# Patient Record
Sex: Male | Born: 1965 | Race: White | Hispanic: No | Marital: Married | Smoking: Former smoker
Health system: Southern US, Community
[De-identification: ages and names within clinical notes are randomized; demographics above are authoritative.]

## PROBLEM LIST (undated history)

## (undated) DIAGNOSIS — M1990 Unspecified osteoarthritis, unspecified site: Secondary | ICD-10-CM

## (undated) DIAGNOSIS — I10 Essential (primary) hypertension: Secondary | ICD-10-CM

## (undated) DIAGNOSIS — E781 Pure hyperglyceridemia: Secondary | ICD-10-CM

## (undated) DIAGNOSIS — R809 Proteinuria, unspecified: Secondary | ICD-10-CM

## (undated) DIAGNOSIS — N529 Male erectile dysfunction, unspecified: Secondary | ICD-10-CM

## (undated) DIAGNOSIS — M8700 Idiopathic aseptic necrosis of unspecified bone: Secondary | ICD-10-CM

## (undated) DIAGNOSIS — E669 Obesity, unspecified: Secondary | ICD-10-CM

## (undated) HISTORY — DX: Idiopathic aseptic necrosis of unspecified bone: M87.00

## (undated) HISTORY — DX: Pure hyperglyceridemia: E78.1

## (undated) HISTORY — DX: Proteinuria, unspecified: R80.9

## (undated) HISTORY — PX: MOUTH SURGERY: SHX715

## (undated) HISTORY — DX: Obesity, unspecified: E66.9

## (undated) HISTORY — DX: Male erectile dysfunction, unspecified: N52.9

---

## 2009-12-15 ENCOUNTER — Ambulatory Visit: Payer: Self-pay | Admitting: Otolaryngology

## 2011-11-14 ENCOUNTER — Ambulatory Visit: Payer: Self-pay | Admitting: Sports Medicine

## 2011-12-22 ENCOUNTER — Other Ambulatory Visit (HOSPITAL_COMMUNITY): Payer: Self-pay | Admitting: Orthopaedic Surgery

## 2011-12-29 ENCOUNTER — Encounter (HOSPITAL_COMMUNITY): Payer: Self-pay | Admitting: Pharmacy Technician

## 2012-01-03 ENCOUNTER — Encounter (HOSPITAL_COMMUNITY)
Admission: RE | Admit: 2012-01-03 | Discharge: 2012-01-03 | Disposition: A | Discharge status: Home / Self Care | Payer: BC Managed Care – PPO | Source: Ambulatory Visit | Attending: Orthopaedic Surgery | Admitting: Orthopaedic Surgery

## 2012-01-03 ENCOUNTER — Encounter (HOSPITAL_COMMUNITY): Payer: Self-pay

## 2012-01-03 ENCOUNTER — Ambulatory Visit (HOSPITAL_COMMUNITY)
Admission: RE | Admit: 2012-01-03 | Discharge: 2012-01-03 | Disposition: A | Discharge status: Home / Self Care | Payer: BC Managed Care – PPO | Source: Ambulatory Visit | Attending: Orthopaedic Surgery | Admitting: Orthopaedic Surgery

## 2012-01-03 ENCOUNTER — Other Ambulatory Visit: Payer: Self-pay

## 2012-01-03 DIAGNOSIS — I10 Essential (primary) hypertension: Secondary | ICD-10-CM | POA: Insufficient documentation

## 2012-01-03 DIAGNOSIS — Z01812 Encounter for preprocedural laboratory examination: Secondary | ICD-10-CM | POA: Insufficient documentation

## 2012-01-03 DIAGNOSIS — Z01818 Encounter for other preprocedural examination: Secondary | ICD-10-CM | POA: Insufficient documentation

## 2012-01-03 DIAGNOSIS — M87059 Idiopathic aseptic necrosis of unspecified femur: Secondary | ICD-10-CM | POA: Insufficient documentation

## 2012-01-03 DIAGNOSIS — Z01810 Encounter for preprocedural cardiovascular examination: Secondary | ICD-10-CM | POA: Insufficient documentation

## 2012-01-03 HISTORY — DX: Essential (primary) hypertension: I10

## 2012-01-03 HISTORY — DX: Unspecified osteoarthritis, unspecified site: M19.90

## 2012-01-03 LAB — URINALYSIS, ROUTINE W REFLEX MICROSCOPIC
Glucose, UA: NEGATIVE mg/dL
Hgb urine dipstick: NEGATIVE
Leukocytes, UA: NEGATIVE
Specific Gravity, Urine: 1.018 (ref 1.005–1.030)
Urobilinogen, UA: 0.2 mg/dL (ref 0.0–1.0)

## 2012-01-03 LAB — PROTIME-INR: INR: 0.94 (ref 0.00–1.49)

## 2012-01-03 LAB — CBC
HCT: 43.7 % (ref 39.0–52.0)
Hemoglobin: 14.7 g/dL (ref 13.0–17.0)
RDW: 12.4 % (ref 11.5–15.5)
WBC: 4.2 10*3/uL (ref 4.0–10.5)

## 2012-01-03 LAB — BASIC METABOLIC PANEL
Chloride: 102 mEq/L (ref 96–112)
GFR calc Af Amer: >90 mL/min (ref >90)
GFR calc non Af Amer: >90 mL/min (ref >90)
Potassium: 4.1 mEq/L (ref 3.5–5.1)
Sodium: 139 mEq/L (ref 135–145)

## 2012-01-03 LAB — SURGICAL PCR SCREEN
MRSA, PCR: POSITIVE — AB
Staphylococcus aureus: POSITIVE — AB

## 2012-01-03 NOTE — Pre-Procedure Instructions (Signed)
Spoke with Judeth Cornfield at [employer]- notified her of abnormal urine and will have provider review

## 2012-01-03 NOTE — Patient Instructions (Addendum)
20 Sergio Parker  01/03/2012   Your procedure is scheduled on:  01/12/12       Friday     Surgery 0930-1100  Report to [facility] at   0700    AM.  Call this number if you have problems the morning of surgery: [phone]     Or PST   [id number]  [facility]   Remember:   Do not eat food:After Midnight. Thursday night  May have clear liquids: Thursday night until midnight   Then none and no snuff  Clear liquids include soda, tea, black coffee, apple or grape juice, broth.  Take these medicines the morning of surgery with A SIP OF WATER:  none   Do not wear jewelry, make-up or nail polish.  Do not wear lotions, powders, or perfumes. You may wear deodorant.  Do not shave 48 hours prior to surgery.  Do not bring valuables to the hospital.  Contacts, dentures or bridgework may not be worn into surgery.  Leave suitcase in the car. After surgery it may be brought to your room.  For patients admitted to the hospital, checkout time is 11:00 AM the day of discharge.   Patients discharged the day of surgery will not be allowed to drive home.  Name and phone number of your driver:  Sheralyn Boatman    wife                                                                    Special Instructions: CHG Shower Use Special Wash: 1/2 bottle night before surgery and 1/2 bottle morning of surgery. REGULAR SOAP FACE AND PRIVATES                      MEN-MAY SHAVE FACE MORNING OF SURGERY  Please read over the following fact sheets that you were given: MRSA Information

## 2012-01-04 NOTE — Pre-Procedure Instructions (Signed)
EKG  Ok per Dr Okey Dupre.  I had attempted to obtain old report for comparison  from Dr Neale Burly- none per Medical Records

## 2012-01-12 ENCOUNTER — Encounter (HOSPITAL_COMMUNITY): Payer: Self-pay | Admitting: *Deleted

## 2012-01-12 ENCOUNTER — Ambulatory Visit (HOSPITAL_COMMUNITY): Payer: BC Managed Care – PPO

## 2012-01-12 ENCOUNTER — Encounter (HOSPITAL_COMMUNITY): Payer: Self-pay | Admitting: Anesthesiology

## 2012-01-12 ENCOUNTER — Encounter (HOSPITAL_COMMUNITY)
Admission: RE | Disposition: A | Discharge status: Home / Self Care | Payer: Self-pay | Source: Ambulatory Visit | Attending: Orthopaedic Surgery

## 2012-01-12 ENCOUNTER — Inpatient Hospital Stay (HOSPITAL_COMMUNITY)
Admission: RE | Admit: 2012-01-12 | Discharge: 2012-01-14 | DRG: 818 | Disposition: A | Discharge status: Home / Self Care | Payer: BC Managed Care – PPO | Source: Ambulatory Visit | Attending: Orthopaedic Surgery | Admitting: Orthopaedic Surgery

## 2012-01-12 ENCOUNTER — Ambulatory Visit (HOSPITAL_COMMUNITY): Payer: BC Managed Care – PPO | Admitting: Anesthesiology

## 2012-01-12 DIAGNOSIS — I10 Essential (primary) hypertension: Secondary | ICD-10-CM | POA: Diagnosis present

## 2012-01-12 DIAGNOSIS — M129 Arthropathy, unspecified: Secondary | ICD-10-CM | POA: Diagnosis present

## 2012-01-12 DIAGNOSIS — M87059 Idiopathic aseptic necrosis of unspecified femur: Principal | ICD-10-CM | POA: Diagnosis present

## 2012-01-12 DIAGNOSIS — Z87891 Personal history of nicotine dependence: Secondary | ICD-10-CM

## 2012-01-12 HISTORY — PX: TOTAL HIP ARTHROPLASTY: SHX124

## 2012-01-12 LAB — TYPE AND SCREEN

## 2012-01-12 LAB — ABO/RH: ABO/RH(D): O POS

## 2012-01-12 SURGERY — ARTHROPLASTY, HIP, TOTAL, ANTERIOR APPROACH
Anesthesia: General | Site: Hip | Laterality: Left | Wound class: Clean

## 2012-01-12 MED ORDER — CEFAZOLIN SODIUM-DEXTROSE 2-3 GM-% IV SOLR: 2.0000 g | INTRAVENOUS | Status: DC

## 2012-01-12 MED ORDER — FENTANYL CITRATE 0.05 MG/ML IJ SOLN
25.0000 ug | INTRAMUSCULAR | Status: DC | PRN
  Administered 2012-01-12 (×2): 50 ug via INTRAVENOUS

## 2012-01-12 MED ORDER — SODIUM CHLORIDE 0.9 % IV SOLN
INTRAVENOUS | Status: DC
  Administered 2012-01-12: 17:00:00 via INTRAVENOUS

## 2012-01-12 MED ORDER — SODIUM CHLORIDE 0.9 % IJ SOLN: 9.0000 mL | INTRAMUSCULAR | Status: DC | PRN

## 2012-01-12 MED ORDER — NALOXONE HCL 0.4 MG/ML IJ SOLN: 0.4000 mg | INTRAMUSCULAR | Status: DC | PRN

## 2012-01-12 MED ORDER — MORPHINE SULFATE (PF) 1 MG/ML IV SOLN
INTRAVENOUS | Status: DC
  Administered 2012-01-12: 15 mg via INTRAVENOUS
  Administered 2012-01-12: 13.5 mg via INTRAVENOUS
  Administered 2012-01-12: 6 mg via INTRAVENOUS
  Administered 2012-01-13: 10.5 mg via INTRAVENOUS
  Administered 2012-01-13: 12 mg via INTRAVENOUS
  Filled 2012-01-12 (×2): qty 25

## 2012-01-12 MED ORDER — SODIUM CHLORIDE 0.9 % IR SOLN
Status: DC | PRN
  Administered 2012-01-12: 1000 mL

## 2012-01-12 MED ORDER — ONDANSETRON HCL 4 MG/2ML IJ SOLN: 4.0000 mg | Freq: Four times a day (QID) | INTRAMUSCULAR | Status: DC | PRN

## 2012-01-12 MED ORDER — PROMETHAZINE HCL 25 MG/ML IJ SOLN: 6.2500 mg | INTRAMUSCULAR | Status: DC | PRN

## 2012-01-12 MED ORDER — FENTANYL CITRATE 0.05 MG/ML IJ SOLN
INTRAMUSCULAR | Status: DC | PRN
  Administered 2012-01-12 (×3): 50 ug via INTRAVENOUS
  Administered 2012-01-12 (×2): 100 ug via INTRAVENOUS
  Administered 2012-01-12 (×3): 50 ug via INTRAVENOUS

## 2012-01-12 MED ORDER — FERROUS SULFATE 325 (65 FE) MG PO TABS
325.0000 mg | ORAL_TABLET | Freq: Three times a day (TID) | ORAL | Status: DC
  Administered 2012-01-12 – 2012-01-14 (×6): 325 mg via ORAL
  Filled 2012-01-12 (×6): qty 1

## 2012-01-12 MED ORDER — MORPHINE SULFATE 2 MG/ML IJ SOLN: 1.0000 mg | INTRAMUSCULAR | Status: DC | PRN

## 2012-01-12 MED ORDER — PHENOL 1.4 % MT LIQD: 1.0000 | OROMUCOSAL | Status: DC | PRN

## 2012-01-12 MED ORDER — ONDANSETRON HCL 4 MG PO TABS: 4.0000 mg | ORAL_TABLET | Freq: Four times a day (QID) | ORAL | Status: DC | PRN

## 2012-01-12 MED ORDER — ROCURONIUM BROMIDE 100 MG/10ML IV SOLN
INTRAVENOUS | Status: DC | PRN
  Administered 2012-01-12: 20 mg via INTRAVENOUS
  Administered 2012-01-12: 10 mg via INTRAVENOUS
  Administered 2012-01-12: 50 mg via INTRAVENOUS

## 2012-01-12 MED ORDER — METHOCARBAMOL 500 MG PO TABS
500.0000 mg | ORAL_TABLET | Freq: Four times a day (QID) | ORAL | Status: DC | PRN
  Administered 2012-01-13 – 2012-01-14 (×4): 500 mg via ORAL
  Filled 2012-01-12 (×4): qty 1

## 2012-01-12 MED ORDER — LACTATED RINGERS IV SOLN
INTRAVENOUS | Status: DC
  Administered 2012-01-12: 1000 mL via INTRAVENOUS

## 2012-01-12 MED ORDER — LACTATED RINGERS IV SOLN: INTRAVENOUS | Status: DC

## 2012-01-12 MED ORDER — ACETAMINOPHEN 650 MG RE SUPP: 650.0000 mg | Freq: Four times a day (QID) | RECTAL | Status: DC | PRN

## 2012-01-12 MED ORDER — METHOCARBAMOL 100 MG/ML IJ SOLN
500.0000 mg | Freq: Four times a day (QID) | INTRAVENOUS | Status: DC | PRN
  Administered 2012-01-12 – 2012-01-13 (×2): 500 mg via INTRAVENOUS
  Filled 2012-01-12 (×3): qty 5

## 2012-01-12 MED ORDER — RIVAROXABAN 10 MG PO TABS
10.0000 mg | ORAL_TABLET | Freq: Every day | ORAL | Status: DC
  Administered 2012-01-13 – 2012-01-14 (×2): 10 mg via ORAL
  Filled 2012-01-12 (×2): qty 1

## 2012-01-12 MED ORDER — OXYCODONE HCL 5 MG PO TABS
5.0000 mg | ORAL_TABLET | ORAL | Status: DC | PRN
  Administered 2012-01-13 (×4): 10 mg via ORAL
  Filled 2012-01-12 (×4): qty 2

## 2012-01-12 MED ORDER — HYDROCODONE-ACETAMINOPHEN 5-325 MG PO TABS
1.0000 | ORAL_TABLET | ORAL | Status: DC | PRN
  Administered 2012-01-14 (×3): 2 via ORAL
  Filled 2012-01-12 (×4): qty 2

## 2012-01-12 MED ORDER — GLYCOPYRROLATE 0.2 MG/ML IJ SOLN
INTRAMUSCULAR | Status: DC | PRN
  Administered 2012-01-12: 0.6 mg via INTRAVENOUS

## 2012-01-12 MED ORDER — METOCLOPRAMIDE HCL 5 MG/ML IJ SOLN: 5.0000 mg | Freq: Three times a day (TID) | INTRAMUSCULAR | Status: DC | PRN

## 2012-01-12 MED ORDER — CEFAZOLIN SODIUM 1-5 GM-% IV SOLN
INTRAVENOUS | Status: AC
  Filled 2012-01-12: qty 100

## 2012-01-12 MED ORDER — ZOLPIDEM TARTRATE 5 MG PO TABS: 5.0000 mg | ORAL_TABLET | Freq: Every evening | ORAL | Status: DC | PRN

## 2012-01-12 MED ORDER — DOCUSATE SODIUM 100 MG PO CAPS
100.0000 mg | ORAL_CAPSULE | Freq: Two times a day (BID) | ORAL | Status: DC
  Administered 2012-01-12 – 2012-01-14 (×4): 100 mg via ORAL
  Filled 2012-01-12 (×4): qty 1

## 2012-01-12 MED ORDER — DIPHENHYDRAMINE HCL 50 MG/ML IJ SOLN: 12.5000 mg | Freq: Four times a day (QID) | INTRAMUSCULAR | Status: DC | PRN

## 2012-01-12 MED ORDER — FENTANYL CITRATE 0.05 MG/ML IJ SOLN
INTRAMUSCULAR | Status: AC
  Filled 2012-01-12: qty 2

## 2012-01-12 MED ORDER — CEFAZOLIN SODIUM 1-5 GM-% IV SOLN
INTRAVENOUS | Status: DC | PRN
  Administered 2012-01-12: 2 g via INTRAVENOUS

## 2012-01-12 MED ORDER — MORPHINE SULFATE (PF) 1 MG/ML IV SOLN
INTRAVENOUS | Status: AC
  Filled 2012-01-12: qty 25

## 2012-01-12 MED ORDER — MIDAZOLAM HCL 5 MG/5ML IJ SOLN
INTRAMUSCULAR | Status: DC | PRN
  Administered 2012-01-12: 2 mg via INTRAVENOUS

## 2012-01-12 MED ORDER — LIDOCAINE HCL (CARDIAC) 20 MG/ML IV SOLN
INTRAVENOUS | Status: DC | PRN
  Administered 2012-01-12: 50 mg via INTRAVENOUS

## 2012-01-12 MED ORDER — ALUM & MAG HYDROXIDE-SIMETH 200-200-20 MG/5ML PO SUSP: 30.0000 mL | ORAL | Status: DC | PRN

## 2012-01-12 MED ORDER — DIPHENHYDRAMINE HCL 12.5 MG/5ML PO ELIX: 12.5000 mg | ORAL_SOLUTION | Freq: Four times a day (QID) | ORAL | Status: DC | PRN

## 2012-01-12 MED ORDER — DIPHENHYDRAMINE HCL 12.5 MG/5ML PO ELIX
12.5000 mg | ORAL_SOLUTION | Freq: Four times a day (QID) | ORAL | Status: DC | PRN
  Administered 2012-01-13: 12.5 mg via ORAL
  Filled 2012-01-12: qty 5

## 2012-01-12 MED ORDER — CEFAZOLIN SODIUM 1-5 GM-% IV SOLN
1.0000 g | Freq: Four times a day (QID) | INTRAVENOUS | Status: AC
  Administered 2012-01-12 – 2012-01-13 (×3): 1 g via INTRAVENOUS
  Filled 2012-01-12 (×3): qty 50

## 2012-01-12 MED ORDER — PROPOFOL 10 MG/ML IV BOLUS
INTRAVENOUS | Status: DC | PRN
  Administered 2012-01-12: 200 mg via INTRAVENOUS

## 2012-01-12 MED ORDER — LACTATED RINGERS IV SOLN
INTRAVENOUS | Status: DC | PRN
  Administered 2012-01-12 (×3): via INTRAVENOUS

## 2012-01-12 MED ORDER — NEOSTIGMINE METHYLSULFATE 1 MG/ML IJ SOLN
INTRAMUSCULAR | Status: DC | PRN
  Administered 2012-01-12: 5 mg via INTRAVENOUS

## 2012-01-12 MED ORDER — METOCLOPRAMIDE HCL 10 MG PO TABS: 5.0000 mg | ORAL_TABLET | Freq: Three times a day (TID) | ORAL | Status: DC | PRN

## 2012-01-12 MED ORDER — DIPHENHYDRAMINE HCL 12.5 MG/5ML PO ELIX: 12.5000 mg | ORAL_SOLUTION | ORAL | Status: DC | PRN

## 2012-01-12 MED ORDER — DEXAMETHASONE SODIUM PHOSPHATE 10 MG/ML IJ SOLN
INTRAMUSCULAR | Status: DC | PRN
  Administered 2012-01-12: 10 mg via INTRAVENOUS

## 2012-01-12 MED ORDER — ACETAMINOPHEN 325 MG PO TABS: 650.0000 mg | ORAL_TABLET | Freq: Four times a day (QID) | ORAL | Status: DC | PRN

## 2012-01-12 MED ORDER — LISINOPRIL 40 MG PO TABS
40.0000 mg | ORAL_TABLET | Freq: Every day | ORAL | Status: DC
  Administered 2012-01-13 – 2012-01-14 (×2): 40 mg via ORAL
  Filled 2012-01-12 (×2): qty 1

## 2012-01-12 MED ORDER — DIPHENHYDRAMINE HCL 50 MG/ML IJ SOLN
12.5000 mg | Freq: Four times a day (QID) | INTRAMUSCULAR | Status: DC | PRN
  Administered 2012-01-13: 12.5 mg via INTRAVENOUS
  Filled 2012-01-12: qty 1

## 2012-01-12 MED ORDER — MORPHINE SULFATE (PF) 1 MG/ML IV SOLN
INTRAVENOUS | Status: DC
  Administered 2012-01-12: 12:00:00 via INTRAVENOUS
  Administered 2012-01-12: 6 mg via INTRAVENOUS

## 2012-01-12 MED ORDER — ONDANSETRON HCL 4 MG/2ML IJ SOLN
INTRAMUSCULAR | Status: DC | PRN
  Administered 2012-01-12: 4 mg via INTRAVENOUS

## 2012-01-12 MED ORDER — ACETAMINOPHEN 10 MG/ML IV SOLN
INTRAVENOUS | Status: AC
  Filled 2012-01-12: qty 100

## 2012-01-12 MED ORDER — MENTHOL 3 MG MT LOZG: 1.0000 | LOZENGE | OROMUCOSAL | Status: DC | PRN

## 2012-01-12 SURGICAL SUPPLY — 35 items
BAG SPEC THK2 15X12 ZIP CLS (MISCELLANEOUS) ×2
BAG ZIPLOCK 12X15 (MISCELLANEOUS) ×4 IMPLANT
BLADE SAW SGTL 18X1.27X75 (BLADE) ×2 IMPLANT
CELLS DAT CNTRL 66122 CELL SVR (MISCELLANEOUS) ×1 IMPLANT
CLOTH BEACON ORANGE TIMEOUT ST (SAFETY) ×2 IMPLANT
DRAPE C-ARM 42X72 X-RAY (DRAPES) ×2 IMPLANT
DRAPE STERI IOBAN 125X83 (DRAPES) ×2 IMPLANT
DRAPE U-SHAPE 47X51 STRL (DRAPES) ×6 IMPLANT
DRSG MEPILEX BORDER 4X8 (GAUZE/BANDAGES/DRESSINGS) ×2 IMPLANT
DURAPREP 26ML APPLICATOR (WOUND CARE) ×2 IMPLANT
ELECT BLADE TIP CTD 4 INCH (ELECTRODE) ×2 IMPLANT
ELECT REM PT RETURN 9FT ADLT (ELECTROSURGICAL) ×2
ELECTRODE REM PT RTRN 9FT ADLT (ELECTROSURGICAL) ×1 IMPLANT
FACESHIELD LNG OPTICON STERILE (SAFETY) ×8 IMPLANT
GAUZE XEROFORM 1X8 LF (GAUZE/BANDAGES/DRESSINGS) ×2 IMPLANT
GLOVE BIO SURGEON STRL SZ7 (GLOVE) ×2 IMPLANT
GLOVE BIO SURGEON STRL SZ7.5 (GLOVE) ×2 IMPLANT
GLOVE BIOGEL PI IND STRL 7.5 (GLOVE) IMPLANT
GLOVE BIOGEL PI IND STRL 8 (GLOVE) ×1 IMPLANT
GLOVE BIOGEL PI INDICATOR 7.5 (GLOVE)
GLOVE BIOGEL PI INDICATOR 8 (GLOVE) ×1
GLOVE ECLIPSE 7.0 STRL STRAW (GLOVE) ×2 IMPLANT
GOWN STRL REIN XL XLG (GOWN DISPOSABLE) ×4 IMPLANT
KIT BASIN OR (CUSTOM PROCEDURE TRAY) ×2 IMPLANT
PACK TOTAL JOINT (CUSTOM PROCEDURE TRAY) ×2 IMPLANT
PADDING CAST COTTON 6X4 STRL (CAST SUPPLIES) ×2 IMPLANT
RTRCTR WOUND ALEXIS 18CM MED (MISCELLANEOUS) ×2
STAPLER SKIN PROX WIDE 3.9 (STAPLE) IMPLANT
SUT ETHIBOND NAB CT1 #1 30IN (SUTURE) ×4 IMPLANT
SUT VIC AB 1 CT1 36 (SUTURE) ×4 IMPLANT
SUT VIC AB 2-0 CT1 27 (SUTURE) ×4
SUT VIC AB 2-0 CT1 TAPERPNT 27 (SUTURE) ×2 IMPLANT
TOWEL OR 17X26 10 PK STRL BLUE (TOWEL DISPOSABLE) ×4 IMPLANT
TOWEL OR NON WOVEN STRL DISP B (DISPOSABLE) ×2 IMPLANT
TRAY FOLEY CATH 14FRSI W/METER (CATHETERS) ×2 IMPLANT

## 2012-01-12 NOTE — Anesthesia Preprocedure Evaluation (Signed)
Anesthesia Evaluation  Patient identified by MRN, date of birth, ID band Patient awake    Reviewed: Allergy & Precautions, H&P , NPO status , Patient's Chart, lab work & pertinent test results  History of Anesthesia Complications Negative for: history of anesthetic complications  Airway Mallampati: II TM Distance: >3 FB Neck ROM: Full    Dental  (+) Teeth Intact, Poor Dentition and Chipped,    Pulmonary neg pulmonary ROS,  breath sounds clear to auscultation  Pulmonary exam normal       Cardiovascular hypertension, Pt. on medications negative cardio ROS  Rhythm:Regular Rate:Normal     Neuro/Psych negative neurological ROS  negative psych ROS   GI/Hepatic negative GI ROS, Neg liver ROS, (+)     substance abuse (Chews tobacco)  alcohol use,   Endo/Other  negative endocrine ROS  Renal/GU negative Renal ROS  negative genitourinary   Musculoskeletal negative musculoskeletal ROS (+)   Abdominal   Peds  Hematology negative hematology ROS (+)   Anesthesia Other Findings   Reproductive/Obstetrics negative OB ROS                           Anesthesia Physical Anesthesia Plan  ASA: II  Anesthesia Plan: General   Post-op Pain Management:    Induction: Intravenous  Airway Management Planned: Oral ETT  Additional Equipment:   Intra-op Plan:   Post-operative Plan: Extubation in OR  Informed Consent: I have reviewed the patients History and Physical, chart, labs and discussed the procedure including the risks, benefits and alternatives for the proposed anesthesia with the patient or authorized representative who has indicated his/her understanding and acceptance.   Dental advisory given  Plan Discussed with: CRNA  Anesthesia Plan Comments:         Anesthesia Quick Evaluation

## 2012-01-12 NOTE — Preoperative (Signed)
Beta Blockers   Reason not to administer Beta Blockers:Not Applicable 

## 2012-01-12 NOTE — Transfer of Care (Signed)
Immediate Anesthesia Transfer of Care Note  Patient: Sergio Parker  Procedure(s) Performed: Procedure(s) (LRB): TOTAL HIP ARTHROPLASTY ANTERIOR APPROACH (Left)  Patient Location: PACU  Anesthesia Type: General  Level of Consciousness: awake, alert , patient cooperative and responds to stimulation  Airway & Oxygen Therapy: Patient Spontanous Breathing and Patient connected to face mask oxygen  Post-op Assessment: Report given to PACU RN, Post -op Vital signs reviewed and stable and Patient moving all extremities X 4  Post vital signs: Reviewed and stable  Complications: No apparent anesthesia complications

## 2012-01-12 NOTE — Op Note (Signed)
NAMENEIZAN, DEBRUHL NO.:  [id number]  MEDICAL RECORD NO.:  [id number]  LOCATION:  1614                         FACILITY:  [facility]  PHYSICIAN:  Vanita Panda. Magnus Ivan, M.D.DATE OF BIRTH:  03-20-1966  DATE OF PROCEDURE:  01/12/2012 DATE OF DISCHARGE:                              OPERATIVE REPORT   PREOPERATIVE DIAGNOSIS:  Left hip end-stage avascular necrosis.  POSTOPERATIVE DIAGNOSIS:  Left hip end-stage avascular necrosis.  PROCEDURE:  Left total hip arthroplasty through direct anterior approach.  IMPLANTS:  DePuy sector Gription acetabular component size 50, size 32+ 4 neutral polyethylene liner, size 9 Corail femoral component with standard offset, and size 32+ 1 ceramic hip ball.  SURGEON:  Vanita Panda. Magnus Ivan, M.D.  ASSISTANT:  Veverly Fells. Ophelia Charter, M.D.  ANESTHESIA:  General.  BLOOD LOSS:  Less than 800 cc.  COMPLICATIONS:  None.  INDICATIONS:  Sergio Parker is a 46 year old patient well-known to me.  He has avascular necrosis in both of his hips with the left worse than right. This was confirmed from plain films and MRI.  His left hip hurts him on a daily basis.  He has pain with internal, external rotation and flexion extension, it is a daily grind now for him.  He has tried injections, anti-inflammatories, and everything has failed, and he is at the point where he needs to still continue work and wishes to proceed with a direct anterior hip replacement as he is someone who gets down on his knees quite a bit at work.  The risks and benefits of surgery were explained to him in detail, and he does wish to proceed with surgery.  DESCRIPTION OF THE PROCEDURE:  After informed consent was obtained, appropriate left hip was marked.  He was brought to the operating room and while on the stretcher general anesthesia was obtained.  A Foley catheter was placed and then traction boots were placed on his feet.  He was then placed supine on the Hana fracture  table.  Perineal post was placed and both feet were placed in traction devices, but no traction applied.  His left hip was then prepped and draped with DuraPrep and sterile drapes.  A time-out was called to identify the correct patient and correct left hip.  I then made an incision just distal and inferior to the anterior superior iliac spine and carried this obliquely down the leg.  I dissected down to the tensor fascia lata muscle and then divided the tensor fascia obliquely to proceed with direct anterior approach to the hip.  A Cobb retractor was placed around the lateral neck and then one medially up underneath the rectus femoris.  I then cauterized the lateral femoral circumflex vessels and was able to get down to the hip capsule and divided the hip capsule, put the Cobb retractors within the hip capsule.  I made my femoral neck cut just proximal to the lesser trochanter and I used a cord screw guide to remove some of the femoral head.  There was significant collapse of the femoral head assuring the AVN.  I then cleaned the acetabulum of debris, and then placing a bent Hohmann medially and a Cobb retractor laterally began reaming from size  42 reamer up in 10 mm increments up to a size 50.  The size 50 was felt to be most stable, and I passed the last few reamers under direct visualization and fluoroscopic guidance.  Once I was able to obtain my inclination and version as well as depth, I placed a real size 50 acetabular component Gription with Gription from DePuy.  I knocked this into place and did not need to fill the screw holes.  All traction was off the leg and I externally rotated the leg to 90 degrees.  I extended and adducted the leg.  Then I began broaching from a size 8 broach to a size 9 because he had such thick cortical bone with him being only 46 years old.  I trialed a standard neck with a 32+ 1 hip ball and reduced this in the acetabulum, and it was surprisingly stable  with internal, external rotation.  He had the correct version.  I was pleased with the leg length measuring the hip center and the pelvis.  I then removed all trial components and placed the real HA-coated femoral component from Corail and DePuy size 9.  I placed the real ceramic 32+ 1 hip ball and reduced this back into the acetabulum, it was again stable.  His leg lengths were equal as well.  I then irrigated the soft tissue with normal saline solution.  I closed the joint capsule with #1 Ethibond suture followed by running #1 Vicryl in the tensor fascia lata, 2-0 Vicryl subcutaneous tissue, and staples on the skin.  A well-padded sterile dressing was applied.  He was awakened, extubated, and taken to recovery room in stable condition.  All final counts were correct. There were no complications noted.     Vanita Panda. Magnus Ivan, M.D.     CYB/MEDQ  D:  01/12/2012  T:  01/12/2012  Job:  [id number]

## 2012-01-12 NOTE — H&P (Signed)
Sergio Parker is an 46 y.o. male.   Chief Complaint:   Severe pain left hip with known avascular necrosis HPI:   46 yo male with late stage AVN of his left hip.  This now greatly affects his daily life with pain with most activites.  He wishes to proceed with a total hip replacement.  He understands the risks of blood loss, fracture, DVT, and PE.  The goals are decreased pain and improved mobility.  Past Medical History  Diagnosis Date  . Hypertension     PCP Dr Wylene Simmer  . Arthritis     Past Surgical History  Procedure Date  . Mouth surgery     No family history on file. Social History:  reports that he has quit smoking. His smoking use included Cigars. His smokeless tobacco use includes Snuff. He reports that he drinks alcohol. He reports that he does not use illicit drugs.  Allergies: No Known Allergies  Medications Prior to Admission  Medication Dose Route Frequency Provider Last Rate Last Dose  . ceFAZolin (ANCEF) IVPB 2 g/50 mL premix  2 g Intravenous 60 min Pre-Op Kathryne Hitch, MD       No current outpatient prescriptions on file as of 01/12/2012.    No results found for this or any previous visit (from the past 48 hour(s)). No results found.  Review of Systems  All other systems reviewed and are negative.    Blood pressure 137/87, pulse 72, temperature 97.7 F (36.5 C), temperature source Oral, resp. rate 18, height 5\' 8"  (1.727 m), weight 74.503 kg (164 lb 4 oz), SpO2 99.00%. Physical Exam  Constitutional: He is oriented to person, place, and time. He appears well-developed and well-nourished.  HENT:  Head: Normocephalic and atraumatic.  Eyes: EOM are normal. Pupils are equal, round, and reactive to light.  Neck: Normal range of motion. Neck supple.  Cardiovascular: Normal rate and regular rhythm.   Respiratory: Effort normal and breath sounds normal.  GI: Soft. Bowel sounds are normal.  Musculoskeletal:       Left hip: He exhibits decreased range  of motion, bony tenderness and crepitus.  Neurological: He is alert and oriented to person, place, and time.  Skin: Skin is warm and dry.  Psychiatric: He has a normal mood and affect.     Assessment/Plan To the OR today for a left total hip replacement then admission as an inpatient.  Gennesis Hogland Y 01/12/2012, 7:11 AM

## 2012-01-12 NOTE — H&P (Signed)
  I have seen and examined Sergio Parker at the bedside.  He wishes to proceed with a left total hip replacement.  There has been no change since I saw him as an outpatient.  He fully understands the risks and benefits.

## 2012-01-12 NOTE — Brief Op Note (Signed)
01/12/2012  12:06 PM  PATIENT:  Sergio Parker  46 y.o. male  PRE-OPERATIVE DIAGNOSIS:  left hip avascular necrosis  POST-OPERATIVE DIAGNOSIS:  left hip avascular necrosis  PROCEDURE:  Procedure(s) (LRB): TOTAL HIP ARTHROPLASTY ANTERIOR APPROACH (Left)  SURGEON:  Surgeon(s) and Role:    * Kathryne Hitch, MD - Primary    * Eldred Manges, MD - Assisting  PHYSICIAN ASSISTANT:   ASSISTANTS: Annell Greening, MD   ANESTHESIA:   general  EBL:  Total I/O In: 2000 [I.V.:2000] Out: 600 [Urine:400; Blood:200]  BLOOD ADMINISTERED:none  DRAINS: none   LOCAL MEDICATIONS USED:  NONE  SPECIMEN:  No Specimen  DISPOSITION OF SPECIMEN:  N/A  COUNTS:  YES  TOURNIQUET:  * No tourniquets in log *  DICTATION: .Other Dictation: Dictation Number [id number]  PLAN OF CARE: Admit to inpatient   PATIENT DISPOSITION:  PACU - hemodynamically stable.   Delay start of Pharmacological VTE agent (>24hrs) due to surgical blood loss or risk of bleeding: not applicable

## 2012-01-12 NOTE — Anesthesia Procedure Notes (Signed)
Procedure Name: Intubation Date/Time: 01/12/2012 10:20 AM Performed by: Randon Goldsmith CATHERINE PAYNE Pre-anesthesia Checklist: Patient identified, Emergency Drugs available, Suction available and Patient being monitored Patient Re-evaluated:Patient Re-evaluated prior to inductionOxygen Delivery Method: Circle system utilized Preoxygenation: Pre-oxygenation with 100% oxygen Intubation Type: IV induction Ventilation: Mask ventilation without difficulty and Oral airway inserted - appropriate to patient size Laryngoscope Size: Miller and 3 Grade View: Grade II Tube type: Oral Number of attempts: 1 Airway Equipment and Method: Stylet Placement Confirmation: ETT inserted through vocal cords under direct vision,  positive ETCO2 and breath sounds checked- equal and bilateral Secured at: 21 cm Tube secured with: Tape Dental Injury: Teeth and Oropharynx as per pre-operative assessment

## 2012-01-13 LAB — HEPATIC FUNCTION PANEL
AST: 21 U/L (ref 0–37)
Albumin: 3.5 g/dL (ref 3.5–5.2)
Alkaline Phosphatase: 69 U/L (ref 39–117)
Bilirubin, Direct: <0.1 mg/dL (ref 0.0–0.3)
Total Bilirubin: 0.4 mg/dL (ref 0.3–1.2)

## 2012-01-13 LAB — CBC
HCT: 36 % — ABNORMAL LOW (ref 39.0–52.0)
Hemoglobin: 12.1 g/dL — ABNORMAL LOW (ref 13.0–17.0)
MCH: 29.5 pg (ref 26.0–34.0)
MCHC: 33.6 g/dL (ref 30.0–36.0)
RBC: 4.1 MIL/uL — ABNORMAL LOW (ref 4.22–5.81)

## 2012-01-13 LAB — BASIC METABOLIC PANEL
BUN: 8 mg/dL (ref 6–23)
CO2: 29 mEq/L (ref 19–32)
Calcium: 9 mg/dL (ref 8.4–10.5)
Glucose, Bld: 116 mg/dL — ABNORMAL HIGH (ref 70–99)
Sodium: 138 mEq/L (ref 135–145)

## 2012-01-13 MED ORDER — OXYCODONE-ACETAMINOPHEN 5-325 MG PO TABS: 1.0000 | ORAL_TABLET | ORAL | Status: AC | PRN

## 2012-01-13 MED ORDER — RIVAROXABAN 10 MG PO TABS: 10.0000 mg | ORAL_TABLET | Freq: Every day | ORAL | Status: DC

## 2012-01-13 MED ORDER — VITAMINS A & D EX OINT
TOPICAL_OINTMENT | CUTANEOUS | Status: AC
  Administered 2012-01-13
  Filled 2012-01-13: qty 5

## 2012-01-13 NOTE — Progress Notes (Signed)
Physical Therapy Treatment Patient Details Name: Sergio Parker MRN: [id number] DOB: 02/18/1966 Today's Date: 01/13/2012  PT Assessment/Plan  PT - Assessment/Plan Comments on Treatment Session: The patient is progressing very well.  He is impressed with things he can already do now that he could not do before.  He will be physically ready for discharge tomorrow after he practicies the stairs, will defer to MD on his medical status.   PT Plan: Discharge plan remains appropriate;Frequency remains appropriate PT Frequency: 7X/week Follow Up Recommendations: Home health PT Equipment Recommended: Rolling walker with 5" wheels PT Goals  Acute Rehab PT Goals PT Goal: Sit to Stand - Progress: Met PT Goal: Stand to Sit - Progress: Met PT Goal: Ambulate - Progress: Progressing toward goal PT Goal: Perform Home Exercise Program - Progress: Progressing toward goal  PT Treatment Precautions/Restrictions  Precautions Precautions: Other (comment) (NONE per MD order) Restrictions Weight Bearing Restrictions: No Other Position/Activity Restrictions: WBAT Mobility (including Balance) Bed Mobility Bed Mobility: No Supine to Sit: 7: Independent Sitting - Scoot to Edge of Bed: 7: Independent Transfers Sit to Stand: 6: Modified independent (Device/Increase time) Stand to Sit: 6: Modified independent (Device/Increase time) Ambulation/Gait Ambulation/Gait Assistance: 5: Supervision Ambulation/Gait Assistance Details (indicate cue type and reason): supervision for safety, cueing to shorten stride length, raise trunk and rotate pelvis, very guarded gait pattern despite good heel to toe contact.  Ambulation Distance (Feet): 300 Feet Assistive device: Rolling walker Gait Pattern: Step-through pattern;Trunk flexed (rigid pelvis)    Exercise  Total Joint Exercises Ankle Circles/Pumps: AROM;Other reps (comment);Both (12 reps) Quad Sets: AROM;Left;Other reps (comment) (12 reps) Short Arc Quad:  AROM;Left;Other reps (comment) (12 reps) Heel Slides: AROM;Other reps (comment);Left (12 reps) Hip ABduction/ADduction: AROM;Left;Other reps (comment) (12 reps) Long Arc Quad: AROM;Left;Other reps (comment) (12 reps) End of Session PT - End of Session Activity Tolerance: Patient tolerated treatment well Patient left: in bed;with call bell in reach;with family/visitor present (wife in room assisting during treatment. ) General Behavior During Session: [facility] for tasks performed Cognition: [facility] for tasks performed  Hal Norrington B. Jamone Garrido, PT, DPT [phone] 01/13/2012, 3:05 PM

## 2012-01-13 NOTE — Evaluation (Signed)
Physical Therapy Evaluation Patient Details Name: EDIBERTO SENS MRN: [id number] DOB: [birth year] Today's Date: 01/13/2012  Problem List:  Patient Active Problem List  Diagnoses  . Avascular necrosis of hip    Past Medical History:  Past Medical History  Diagnosis Date  . Hypertension     PCP Dr Wylene Simmer  . Arthritis    Past Surgical History:  Past Surgical History  Procedure Date  . Mouth surgery     PT Assessment/Plan/Recommendation PT Assessment Clinical Impression Statement: 46 y.o. male admitted to [facility] for L THA (anterior). He presents today with post-op pain, weakness, decreased normal gait pattern, decreased mobility.   PT Recommendation/Assessment: Patient will need skilled PT in the acute care venue PT Problem List: Decreased strength;Decreased range of motion;Decreased activity tolerance;Decreased balance;Decreased mobility;Decreased knowledge of use of DME;Pain PT Therapy Diagnosis : Difficulty walking;Abnormality of gait;Generalized weakness;Acute pain PT Plan PT Frequency: 7X/week PT Treatment/Interventions: DME instruction;Gait training;Stair training;Functional mobility training;Therapeutic activities;Therapeutic exercise;Balance training;Neuromuscular re-education;Patient/family education PT Recommendation Follow Up Recommendations: Home health PT Equipment Recommended: Rolling walker with 5" wheels PT Goals  Acute Rehab PT Goals PT Goal Formulation: With patient/family Time For Goal Achievement: 7 days Pt will go Sit to Stand: with modified independence PT Goal: Sit to Stand - Progress: Goal set today Pt will go Stand to Sit: with modified independence PT Goal: Stand to Sit - Progress: Goal set today Pt will Transfer Bed to Chair/Chair to Bed: with modified independence PT Transfer Goal: Bed to Chair/Chair to Bed - Progress: Goal set today Pt will Ambulate: >150 feet;with modified independence;with rolling walker;with least restrictive assistive  device PT Goal: Ambulate - Progress: Goal set today Pt will Go Up / Down Stairs: 1-2 stairs;with least restrictive assistive device;with supervision PT Goal: Up/Down Stairs - Progress: Goal set today Pt will Perform Home Exercise Program: Independently PT Goal: Perform Home Exercise Program - Progress: Goal set today  PT Evaluation Precautions/Restrictions  Precautions Precautions: Other (comment) (NONE per MD order) Restrictions Other Position/Activity Restrictions: WBAT Prior Functioning  Home Living Lives With: Spouse;Son (3 y.o. son) Dolores Lory Help From: Family Type of Home: House Home Layout: Two level;Able to live on main level with bedroom/bathroom Home Access: Stairs to enter Entrance Stairs-Rails: None (does have a post he can grab to) Entrance Stairs-Number of Steps: 2 (2, 6" steps) Home Adaptive Equipment: None Prior Function Level of Independence: Independent with basic ADLs;Independent with homemaking with ambulation;Independent with gait;Independent with transfers Able to Take Stairs?: Yes Driving: Yes Vocation: Full time employment Vocation Requirements: works for [employer] Orientation Level: Oriented X4 Extremity Assessment RLE Strength RLE Overall Strength Comments: WFL LLE Strength LLE Overall Strength Comments: ankle WFL, knee 3+/5, hip 3-/5 Mobility (including Balance) Bed Mobility Bed Mobility: Yes Supine to Sit: 7: Independent Sitting - Scoot to Edge of Bed: 7: Independent Transfers Transfers: Yes Sit to Stand: 5: Supervision Sit to Stand Details (indicate cue type and reason): supervision for safety Stand to Sit: 5: Supervision Stand to Sit Details: supervision for safety Ambulation/Gait Ambulation/Gait: Yes Ambulation/Gait Assistance: 4: Min assist Ambulation/Gait Assistance Details (indicate cue type and reason): min guard assist to steady patient for balance/missteps Ambulation Distance (Feet): 150 Feet Assistive device:  Rolling walker Gait Pattern: Step-through pattern (good heel to toe contact, VCs for foot forward on left.  )    Exercise  Total Joint Exercises Ankle Circles/Pumps: AROM;Other reps (comment);Both (12 reps) Quad Sets: AROM;Left;Other reps (comment) (12 reps) Short Arc Quad: AROM;Left;Other reps (comment) (12 reps) Heel Slides: AROM;Other  reps (comment);Left (12 reps) Hip ABduction/ADduction: AROM;Left;Other reps (comment) (12 reps) Long Arc Quad: AROM;Left;Other reps (comment) (12 reps) End of Session PT - End of Session Activity Tolerance: Patient limited by fatigue;Patient limited by pain Patient left: in chair;with call bell in reach;with family/visitor present (sister, wife, mom) General Behavior During Session: [facility] for tasks performed Cognition: [facility] for tasks performed  Gregrey Bloyd B. Markeesha Char, PT, DPT [phone] 01/13/2012, 11:38 AM

## 2012-01-13 NOTE — Progress Notes (Signed)
Subjective: 1 Day Post-Op Procedure(s) (LRB): TOTAL HIP ARTHROPLASTY ANTERIOR APPROACH (Left) Patient reports pain as mild.    Objective: Vital signs in last 24 hours: Temp:  [97.6 F (36.4 C)-99.2 F (37.3 C)] 98.2 F (36.8 C) (03/23 0120) Pulse Rate:  [60-98] 71  (03/23 0120) Resp:  [10-16] 16  (03/23 0808) BP: (139-180)/(70-96) 139/70 mmHg (03/23 0120) SpO2:  [98 %-100 %] 100 % (03/23 0808)  Intake/Output from previous day: 03/22 0701 - 03/23 0700 In: 3980 [P.O.:480; I.V.:3400; IV Piggyback:100] Out: 5525 [Urine:5325; Blood:200] Intake/Output this shift:     Basename 01/13/12 0504  HGB 12.1*    Basename 01/13/12 0504  WBC 7.3  RBC 4.10*  HCT 36.0*  PLT 251    Basename 01/13/12 0504  NA 138  K 4.0  CL 101  CO2 29  BUN 8  CREATININE 0.88  GLUCOSE 116*  CALCIUM 9.0   No results found for this basename: LABPT:2,INR:2 in the last 72 hours  Sensation intact distally Intact pulses distally Dorsiflexion/Plantar flexion intact Incision: dressing C/D/I  Assessment/Plan: 1 Day Post-Op Procedure(s) (LRB): TOTAL HIP ARTHROPLASTY ANTERIOR APPROACH (Left) Up with therapy  Evanna Washinton Y 01/13/2012, 8:56 AM

## 2012-01-13 NOTE — Discharge Instructions (Signed)
Increase your activity as comfort allows. You can get your actual incision wet in the shower starting 01/17/12. Follow-up in 2 weeks.  Call [phone] with questions and concerns.

## 2012-01-13 NOTE — Evaluation (Signed)
Occupational Therapy Evaluation Patient Details Name: Sergio Parker MRN: [id number] DOB: 10/03/1966 Today's Date: 01/13/2012  Problem List:  Patient Active Problem List  Diagnoses  . Avascular necrosis of hip    Past Medical History:  Past Medical History  Diagnosis Date  . Hypertension     PCP Dr Wylene Simmer  . Arthritis    Past Surgical History:  Past Surgical History  Procedure Date  . Mouth surgery     OT Assessment/Plan/Recommendation OT Assessment Clinical Impression Statement: Pt doing extremely well POD#1 anterior THR. All education completed. Pt will have necessary level of A from family upon d/c. No equip needs at this time. OT Recommendation/Assessment: Patient does not need any further OT services OT Recommendation Follow Up Recommendations: No OT follow up Equipment Recommended: None recommended by OT OT Goals    OT Evaluation Precautions/Restrictions  Precautions Precautions: Other (comment) (NONE per MD order) Restrictions Weight Bearing Restrictions: No Other Position/Activity Restrictions: WBAT Prior Functioning Home Living Lives With: Spouse;Son [place] Help From: Family Type of Home: House Home Layout: Two level;Able to live on main level with bedroom/bathroom Home Access: Stairs to enter Entrance Stairs-Rails: None Entrance Stairs-Number of Steps: 2 Bathroom Shower/Tub: Health visitor: Handicapped height Home Adaptive Equipment: Built-in shower seat;Straight cane Prior Function Level of Independence: Independent with basic ADLs;Independent with transfers;Independent with gait;Independent with homemaking with ambulation Able to Take Stairs?: Yes Driving: Yes Vocation: Full time employment Vocation Requirements: works for [employer] ADL ADL Grooming: Simulated;Supervision/safety Where Assessed - Grooming: Standing at sink Upper Body Bathing: Simulated;Supervision/safety Where Assessed - Upper Body Bathing: Standing at  sink Lower Body Bathing: Simulated;Minimal assistance Where Assessed - Lower Body Bathing: Sit to stand from chair Upper Body Dressing: Simulated;Supervision/safety Where Assessed - Upper Body Dressing: Standing Lower Body Dressing: Simulated;Minimal assistance Where Assessed - Lower Body Dressing: Sit to stand from chair Toilet Transfer: Performed;Supervision/safety Toilet Transfer Method: Proofreader: Regular height toilet Toileting - Clothing Manipulation: Simulated;Supervision/safety Where Assessed - Toileting Clothing Manipulation: Sit to stand from 3-in-1 or toilet Toileting - Hygiene: Simulated;Supervision/safety Where Assessed - Toileting Hygiene: Sit to stand from 3-in-1 or toilet Tub/Shower Transfer: Performed;Supervision/safety Tub/Shower Transfer Method: Science writer: Walk in Scientist, research (physical sciences) Used: Rolling walker Vision/Perception    Cognition Cognition Arousal/Alertness: Awake/alert Overall Cognitive Status: Appears within functional limits for tasks assessed Orientation Level: Oriented X4 Sensation/Coordination   Extremity Assessment RUE Assessment RUE Assessment: Within Functional Limits LUE Assessment LUE Assessment: Within Functional Limits Mobility  Bed Mobility Bed Mobility: No Supine to Sit: 7: Independent Sitting - Scoot to Edge of Bed: 7: Independent Transfers Transfers: Yes Sit to Stand: 5: Supervision;From chair/3-in-1;With upper extremity assist;With armrests Sit to Stand Details (indicate cue type and reason): supervision for safety Stand to Sit: 5: Supervision;With upper extremity assist;To toilet;To chair/3-in-1;With armrests Stand to Sit Details: supervision for safety Exercises  End of Session OT - End of Session Activity Tolerance: Patient tolerated treatment well Patient left: in chair;with call bell in reach;with family/visitor present General Behavior During Session: [facility] for tasks  performed Cognition: [facility] for tasks performed   Dietrich Ke A OTR/L [phone] 01/13/2012, 12:19 PM

## 2012-01-14 LAB — CBC
MCH: 29 pg (ref 26.0–34.0)
MCV: 88.5 fL (ref 78.0–100.0)
Platelets: 220 10*3/uL (ref 150–400)
RDW: 12.4 % (ref 11.5–15.5)
WBC: 7.2 10*3/uL (ref 4.0–10.5)

## 2012-01-14 NOTE — Progress Notes (Signed)
Physical Therapy Treatment Patient Details Name: Sergio Parker MRN: [id number] DOB: 06/29/1966 Today's Date: 01/14/2012 [phone] PT Assessment/Plan  PT - Assessment/Plan Comments on Treatment Session: pt has progressed well. goals achieved. for dc PT Plan: Discharge plan remains appropriate;Frequency remains appropriate Equipment Recommended: Rolling walker with 5" wheels PT Goals  Acute Rehab PT Goals Pt will go Sit to Stand: with modified independence PT Goal: Sit to Stand - Progress: Met Pt will go Stand to Sit: with modified independence PT Goal: Stand to Sit - Progress: Met Pt will Transfer Bed to Chair/Chair to Bed: with modified independence PT Transfer Goal: Bed to Chair/Chair to Bed - Progress: Met Pt will Ambulate: >150 feet;with modified independence;with rolling walker;with least restrictive assistive device PT Goal: Ambulate - Progress: Met Pt will Go Up / Down Stairs: 1-2 stairs;with least restrictive assistive device;with supervision PT Goal: Up/Down Stairs - Progress: Met Pt will Perform Home Exercise Program: Independently PT Goal: Perform Home Exercise Program - Progress: Met  PT Treatment Precautions/Restrictions  Precautions Precautions: Other (comment) (NONE per MD order) Restrictions Weight Bearing Restrictions: No Other Position/Activity Restrictions: WBAT Mobility (including Balance) Bed Mobility Supine to Sit: 7: Independent Sitting - Scoot to Edge of Bed: 7: Independent Transfers Sit to Stand: 6: Modified independent (Device/Increase time) Ambulation/Gait Ambulation/Gait Assistance: 5: Supervision Ambulation Distance (Feet): 200 Feet Assistive device: Rolling walker Gait Pattern: Step-through pattern;Trunk flexed;Left circumduction Stairs: Yes Stairs Assistance: 4: Min assist Stairs Assistance Details (indicate cue type and reason): wife present for instruction backwards Stair Management Technique: No rails;Backwards;With  walker;Forwards Number of Stairs: 3  Height of Stairs: 6     Exercise  Total Joint Exercises Ankle Circles/Pumps: Left;AROM;10 reps;Supine Quad Sets: Left;AROM;10 reps Short Arc Quad: AROM;Left;10 reps Heel Slides: AROM;Other reps (comment);Left Hip ABduction/ADduction: Left;10 reps Long Arc Quad: AROM;Left;10 reps End of Session PT - End of Session Activity Tolerance: Patient tolerated treatment well Patient left: in bed;with call bell in reach;with family/visitor present Nurse Communication: Mobility status for transfers General Behavior During Session: [facility] for tasks performed  Rada Hay 01/14/2012, 12:44 PM

## 2012-01-14 NOTE — Progress Notes (Signed)
Cm spoke with pt concerning dc planning. Per pt contacted by Genevieve [facility]lander prior to surgery concerning providing [facility] services upon discharge. Per pt choice Genevieve Norlander to provide [facility] services. PT suggest RW. [facility]iva to provide DME prior to discharge. Pt's wife present at bedside during interview to assist with home care.   Leonie Green [phone]

## 2012-01-14 NOTE — Progress Notes (Signed)
Subjective: Pt stable checked off by PT and nursing   Objective: Vital signs in last 24 hours: Temp:  [98.5 F (36.9 C)-99.2 F (37.3 C)] 98.5 F (36.9 C) (03/24 0611) Pulse Rate:  [85-101] 85  (03/24 0611) Resp:  [16-19] 16  (03/24 0811) BP: (112-145)/(69-77) 117/75 mmHg (03/24 0611) SpO2:  [96 %-100 %] 97 % (03/24 0611)  Intake/Output from previous day: 03/23 0701 - 03/24 0700 In: 1316.8 [P.O.:780; I.V.:536.8] Out: 2500 [Urine:2500] Intake/Output this shift:    Exam:  Neurovascular intact Sensation intact distally Dorsiflexion/Plantar flexion intact  Labs:  Basename 01/14/12 0502 01/13/12 0504  HGB 10.3* 12.1*    Basename 01/14/12 0502 01/13/12 0504  WBC 7.2 7.3  RBC 3.55* 4.10*  HCT 31.4* 36.0*  PLT 220 251    Basename 01/13/12 0504  NA 138  K 4.0  CL 101  CO2 29  BUN 8  CREATININE 0.88  GLUCOSE 116*  CALCIUM 9.0   No results found for this basename: LABPT:2,INR:2 in the last 72 hours  Assessment/Plan: Pt doing well - for dc today   DEAN,Aedyn SCOTT 01/14/2012, 9:55 AM

## 2012-01-14 NOTE — Progress Notes (Signed)
Pt discharged to family auto via w/c. Assessment unchanged from am. 

## 2012-01-19 ENCOUNTER — Encounter (HOSPITAL_COMMUNITY): Payer: Self-pay | Admitting: Orthopaedic Surgery

## 2012-01-22 NOTE — Anesthesia Postprocedure Evaluation (Signed)
Anesthesia Post Note  Patient: Sergio Parker  Procedure(s) Performed: Procedure(s) (LRB): TOTAL HIP ARTHROPLASTY ANTERIOR APPROACH (Left)  Anesthesia type: General  Patient location: PACU  Post pain: Pain level controlled  Post assessment: Post-op Vital signs reviewed  Last Vitals:  Filed Vitals:   01/14/12 1224  BP:   Pulse:   Temp:   Resp: 16    Post vital signs: Reviewed  Level of consciousness: sedated  Complications: No apparent anesthesia complications

## 2012-01-25 NOTE — Discharge Summary (Signed)
Patient ID: Sergio Parker MRN: [id number] DOB/AGE: [birth year] 46 y.o.  Admit date: 01/12/2012 Discharge date: 01/25/2012  Admission Diagnoses:  Principal Problem:  *Avascular necrosis of hip   Discharge Diagnoses:  Same  Past Medical History  Diagnosis Date  . Hypertension     PCP Dr Wylene Simmer  . Arthritis     Surgeries: Procedure(s): TOTAL HIP ARTHROPLASTY ANTERIOR APPROACH on 01/12/2012   Consultants:    Discharged Condition: Improved  Hospital Course: Sergio Parker is an 46 y.o. male who was admitted 01/12/2012 for operative treatment ofAvascular necrosis of hip. Patient has severe unremitting pain that affects sleep, daily activities, and work/hobbies. After pre-op clearance the patient was taken to the operating room on 01/12/2012 and underwent  Procedure(s): TOTAL HIP ARTHROPLASTY ANTERIOR APPROACH.    Patient was given perioperative antibiotics:  Anti-infectives     Start     Dose/Rate Route Frequency Ordered Stop   01/12/12 1600   ceFAZolin (ANCEF) IVPB 1 g/50 mL premix        1 g 100 mL/hr over 30 Minutes Intravenous Every 6 hours 01/12/12 1348 01/13/12 0416   01/12/12 0659   ceFAZolin (ANCEF) IVPB 2 g/50 mL premix  Status:  Discontinued        2 g 100 mL/hr over 30 Minutes Intravenous 60 min pre-op 01/12/12 0659 01/12/12 1313           Patient was given sequential compression devices, early ambulation, and chemoprophylaxis to prevent DVT.  Patient benefited maximally from hospital stay and there were no complications.    Recent vital signs: No data found.    Recent laboratory studies: No results found for this basename: WBC:2,HGB:2,HCT:2,PLT:2,NA:2,K:2,CL:2,CO2:2,BUN:2,CREATININE:2,GLUCOSE:2,PT:2,INR:2,CALCIUM,2: in the last 72 hours   Discharge Medications:   Medication List  As of 01/25/2012  8:36 AM   STOP taking these medications         etodolac 500 MG tablet         TAKE these medications         lisinopril 40 MG tablet   Commonly  known as: PRINIVIL,ZESTRIL   Take 40 mg by mouth daily before breakfast.      orphenadrine 100 MG tablet   Commonly known as: NORFLEX   Take 100 mg by mouth daily as needed. Muscle spasm          rivaroxaban 10 MG Tabs tablet   Commonly known as: XARELTO   Take 1 tablet (10 mg total) by mouth daily with breakfast.            Diagnostic Studies: Dg Chest 2 View  01/03/2012  *RADIOLOGY REPORT*  Clinical Data: Preop radiograph.  Hip replacement.  CHEST - 2 VIEW  Comparison: None  Findings: The heart size and mediastinal contours are within normal limits.  Both lungs are clear.  The visualized skeletal structures are unremarkable.  IMPRESSION: No active cardiopulmonary abnormalities.  Original Report Authenticated By: Rosealee Albee, M.D.   Dg Hip Complete Left  01/12/2012  *RADIOLOGY REPORT*  Clinical Data: Left hip replacement  LEFT HIP - COMPLETE 2+ VIEW  Comparison: None.  Findings: Several C-arm spot films were obtained.  The acetabular and femoral components of the left hip replacement appear to be in good position.  No acute bony abnormality is seen.  IMPRESSION: Left total hip replacement in good position on C-arm spot films.  Original Report Authenticated By: Juline Patch, M.D.   Dg Pelvis Portable  01/12/2012  *RADIOLOGY REPORT*  Clinical Data: Postop hip replacement  PORTABLE PELVIS  Comparison: None.  Findings: A portable view of the pelvis shows the acetabular and femoral components of the left hip replacement in good position. No acute fracture is seen.  The pelvic rami are intact.  IMPRESSION: Left hip replacement in good position.  Original Report Authenticated By: Juline Patch, M.D.   Dg Hip Portable 1 View Left  01/12/2012  *RADIOLOGY REPORT*  Clinical Data: Postop  PORTABLE LEFT HIP - 1 VIEW  Comparison: Intraoperative C-arm spot films of 01/12/2012  Findings: A cross-table lateral view shows the femoral component of the left hip replacement in good position.  No  acute fracture is seen.  IMPRESSION: No acute abnormality.  Original Report Authenticated By: Juline Patch, M.D.   Dg C-arm 46-120 Min-no Report  01/12/2012  CLINICAL DATA: laeft anterior hip   C-ARM 61-120 MINUTES  Fluoroscopy was utilized by the requesting physician.  No radiographic  interpretation.      Disposition: 01-Home or Self Care  Discharge Orders    Future Orders Please Complete By Expires   Diet - low sodium heart healthy      Call MD / Call 911      Comments:   If you experience chest pain or shortness of breath, CALL 911 and be transported to the hospital emergency room.  If you develope a fever above 101 F, pus (white drainage) or increased drainage or redness at the wound, or calf pain, call your surgeon's office.   Constipation Prevention      Comments:   Drink plenty of fluids.  Prune juice may be helpful.  You may use a stool softener, such as Colace (over the counter) 100 mg twice a day.  Use MiraLax (over the counter) for constipation as needed.   Increase activity slowly as tolerated      Weight Bearing as taught in Physical Therapy      Comments:   Use a walker or crutches as instructed.         SignedKathryne Hitch 01/25/2012, 8:36 AM

## 2012-08-12 ENCOUNTER — Other Ambulatory Visit (HOSPITAL_COMMUNITY): Payer: Self-pay | Admitting: Orthopaedic Surgery

## 2012-09-10 ENCOUNTER — Encounter (HOSPITAL_COMMUNITY): Payer: Self-pay | Admitting: Pharmacist

## 2012-09-12 ENCOUNTER — Other Ambulatory Visit (HOSPITAL_COMMUNITY): Payer: Self-pay | Admitting: *Deleted

## 2012-09-13 ENCOUNTER — Encounter (HOSPITAL_COMMUNITY): Payer: Self-pay

## 2012-09-13 ENCOUNTER — Encounter (HOSPITAL_COMMUNITY)
Admission: RE | Admit: 2012-09-13 | Discharge: 2012-09-13 | Disposition: A | Discharge status: Home / Self Care | Payer: BC Managed Care – PPO | Source: Ambulatory Visit | Attending: Orthopaedic Surgery | Admitting: Orthopaedic Surgery

## 2012-09-13 LAB — URINALYSIS, ROUTINE W REFLEX MICROSCOPIC
Glucose, UA: NEGATIVE mg/dL
Hgb urine dipstick: NEGATIVE
Leukocytes, UA: NEGATIVE
Specific Gravity, Urine: 1.018 (ref 1.005–1.030)
pH: 6.5 (ref 5.0–8.0)

## 2012-09-13 LAB — BASIC METABOLIC PANEL
CO2: 31 mEq/L (ref 19–32)
Chloride: 102 mEq/L (ref 96–112)
Creatinine, Ser: 0.9 mg/dL (ref 0.50–1.35)
GFR calc Af Amer: >90 mL/min (ref >90)
Potassium: 4.4 mEq/L (ref 3.5–5.1)

## 2012-09-13 LAB — CBC
HCT: 45 % (ref 39.0–52.0)
Hemoglobin: 15 g/dL (ref 13.0–17.0)
MCV: 87.7 fL (ref 78.0–100.0)
RBC: 5.13 MIL/uL (ref 4.22–5.81)
RDW: 12.9 % (ref 11.5–15.5)
WBC: 5.6 10*3/uL (ref 4.0–10.5)

## 2012-09-13 LAB — PROTIME-INR
INR: 0.84 (ref 0.00–1.49)
Prothrombin Time: 11.5 seconds — ABNORMAL LOW (ref 11.6–15.2)

## 2012-09-13 LAB — SURGICAL PCR SCREEN: MRSA, PCR: NEGATIVE

## 2012-09-13 NOTE — Patient Instructions (Signed)
20      Your procedure is scheduled on:  Friday 09/20/2012 at 0730 am  Report to [facility] at 0530 AM.  Call this number if you have problems the morning of surgery: [phone]   Remember:   Do not eat food or drink liquids after midnight!  Take these medicines the morning of surgery with A SIP OF WATER:  NONE   Do not bring valuables to the hospital.  .  Leave suitcase in the car. After surgery it may be brought to your room.  For patients admitted to the hospital, checkout time is 11:00 AM the day of              Discharge.    Special Instructions: See [facility] Preparing  For Surgery Instruction Sheet. Do not wear jewelry, lotions powders, perfumes. Women do not shave legs or underarms for 12 hours before showers. Contacts, partial plates, or dentures may not be worn into surgery.                          Patients discharged the day of surgery will not be allowed to drive home.If going home the same day of surgery, must have someone stay with you first 24 hrs.at home and arrange for someone to drive you home from the              Hospital. YOUR DRIVER ZO:[id number]   Please read over the following fact sheets that you were given: MRSA INFORMATION,INCENTIVE SPIROMETRY SHEET, SLEEP APNEA SHEET, BLOOD TRANSFUSION SHEET                            Telford Nab.Kahron Kauth,RN,BSN     [phone]

## 2012-09-19 NOTE — Anesthesia Preprocedure Evaluation (Addendum)
Anesthesia Evaluation  Patient identified by MRN, date of birth, ID band Patient awake    Reviewed: Allergy & Precautions, H&P , NPO status , Patient's Chart, lab work & pertinent test results  Airway Mallampati: II TM Distance: >3 FB Neck ROM: full    Dental No notable dental hx. (+) Teeth Intact and Dental Advisory Given   Pulmonary neg pulmonary ROS,  breath sounds clear to auscultation  Pulmonary exam normal       Cardiovascular Exercise Tolerance: Good hypertension, Pt. on medications negative cardio ROS  Rhythm:regular Rate:Normal     Neuro/Psych negative neurological ROS  negative psych ROS   GI/Hepatic negative GI ROS, Neg liver ROS,   Endo/Other  negative endocrine ROS  Renal/GU negative Renal ROS  negative genitourinary   Musculoskeletal   Abdominal   Peds  Hematology negative hematology ROS (+)   Anesthesia Other Findings   Reproductive/Obstetrics negative OB ROS                          Anesthesia Physical Anesthesia Plan  ASA: II  Anesthesia Plan: General   Post-op Pain Management:    Induction: Intravenous  Airway Management Planned: Oral ETT  Additional Equipment:   Intra-op Plan:   Post-operative Plan: Extubation in OR  Informed Consent: I have reviewed the patients History and Physical, chart, labs and discussed the procedure including the risks, benefits and alternatives for the proposed anesthesia with the patient or authorized representative who has indicated his/her understanding and acceptance.   Dental Advisory Given  Plan Discussed with: CRNA and Surgeon  Anesthesia Plan Comments:        Anesthesia Quick Evaluation  

## 2012-09-20 ENCOUNTER — Inpatient Hospital Stay (HOSPITAL_COMMUNITY)
Admission: RE | Admit: 2012-09-20 | Discharge: 2012-09-21 | DRG: 818 | Disposition: A | Discharge status: Home Health | Payer: BC Managed Care – PPO | Source: Ambulatory Visit | Attending: Orthopaedic Surgery | Admitting: Orthopaedic Surgery

## 2012-09-20 ENCOUNTER — Ambulatory Visit (HOSPITAL_COMMUNITY): Payer: BC Managed Care – PPO

## 2012-09-20 ENCOUNTER — Encounter (HOSPITAL_COMMUNITY)
Admission: RE | Disposition: A | Discharge status: Home / Self Care | Payer: Self-pay | Source: Ambulatory Visit | Attending: Orthopaedic Surgery

## 2012-09-20 ENCOUNTER — Encounter (HOSPITAL_COMMUNITY): Payer: Self-pay | Admitting: Anesthesiology

## 2012-09-20 ENCOUNTER — Encounter (HOSPITAL_COMMUNITY): Payer: Self-pay | Admitting: *Deleted

## 2012-09-20 ENCOUNTER — Ambulatory Visit (HOSPITAL_COMMUNITY): Payer: BC Managed Care – PPO | Admitting: Anesthesiology

## 2012-09-20 DIAGNOSIS — M169 Osteoarthritis of hip, unspecified: Secondary | ICD-10-CM | POA: Diagnosis present

## 2012-09-20 DIAGNOSIS — Z87891 Personal history of nicotine dependence: Secondary | ICD-10-CM

## 2012-09-20 DIAGNOSIS — M87051 Idiopathic aseptic necrosis of right femur: Secondary | ICD-10-CM

## 2012-09-20 DIAGNOSIS — M1610 Unilateral primary osteoarthritis, unspecified hip: Secondary | ICD-10-CM | POA: Diagnosis present

## 2012-09-20 DIAGNOSIS — I10 Essential (primary) hypertension: Secondary | ICD-10-CM | POA: Diagnosis present

## 2012-09-20 DIAGNOSIS — Z79899 Other long term (current) drug therapy: Secondary | ICD-10-CM

## 2012-09-20 DIAGNOSIS — M87052 Idiopathic aseptic necrosis of left femur: Secondary | ICD-10-CM

## 2012-09-20 DIAGNOSIS — Z01812 Encounter for preprocedural laboratory examination: Secondary | ICD-10-CM

## 2012-09-20 DIAGNOSIS — Z96659 Presence of unspecified artificial knee joint: Secondary | ICD-10-CM

## 2012-09-20 DIAGNOSIS — M87059 Idiopathic aseptic necrosis of unspecified femur: Secondary | ICD-10-CM

## 2012-09-20 HISTORY — PX: TOTAL HIP ARTHROPLASTY: SHX124

## 2012-09-20 LAB — TYPE AND SCREEN
ABO/RH(D): O POS
Antibody Screen: NEGATIVE

## 2012-09-20 SURGERY — ARTHROPLASTY, HIP, TOTAL, ANTERIOR APPROACH
Anesthesia: General | Site: Hip | Laterality: Right | Wound class: Clean

## 2012-09-20 MED ORDER — ZOLPIDEM TARTRATE 5 MG PO TABS
5.0000 mg | ORAL_TABLET | Freq: Every evening | ORAL | Status: DC | PRN
  Administered 2012-09-20: 5 mg via ORAL
  Filled 2012-09-20: qty 1

## 2012-09-20 MED ORDER — ROCURONIUM BROMIDE 100 MG/10ML IV SOLN
INTRAVENOUS | Status: DC | PRN
  Administered 2012-09-20: 40 mg via INTRAVENOUS
  Administered 2012-09-20: 10 mg via INTRAVENOUS

## 2012-09-20 MED ORDER — NEOSTIGMINE METHYLSULFATE 1 MG/ML IJ SOLN
INTRAMUSCULAR | Status: DC | PRN
  Administered 2012-09-20: 4 mg via INTRAVENOUS

## 2012-09-20 MED ORDER — METHOCARBAMOL 500 MG PO TABS: 500.0000 mg | ORAL_TABLET | Freq: Four times a day (QID) | ORAL | Status: DC | PRN

## 2012-09-20 MED ORDER — METHOCARBAMOL 100 MG/ML IJ SOLN
500.0000 mg | Freq: Four times a day (QID) | INTRAMUSCULAR | Status: DC | PRN
  Administered 2012-09-20: 500 mg via INTRAVENOUS
  Filled 2012-09-20: qty 5

## 2012-09-20 MED ORDER — MIDAZOLAM HCL 5 MG/5ML IJ SOLN
INTRAMUSCULAR | Status: DC | PRN
  Administered 2012-09-20: 2 mg via INTRAVENOUS

## 2012-09-20 MED ORDER — DIPHENHYDRAMINE HCL 12.5 MG/5ML PO ELIX
12.5000 mg | ORAL_SOLUTION | ORAL | Status: DC | PRN
  Administered 2012-09-21: 12.5 mg via ORAL
  Filled 2012-09-20: qty 5

## 2012-09-20 MED ORDER — LIDOCAINE HCL (CARDIAC) 20 MG/ML IV SOLN
INTRAVENOUS | Status: DC | PRN
  Administered 2012-09-20: 50 mg via INTRAVENOUS

## 2012-09-20 MED ORDER — 0.9 % SODIUM CHLORIDE (POUR BTL) OPTIME
TOPICAL | Status: DC | PRN
  Administered 2012-09-20: 1000 mL

## 2012-09-20 MED ORDER — DOCUSATE SODIUM 100 MG PO CAPS
100.0000 mg | ORAL_CAPSULE | Freq: Two times a day (BID) | ORAL | Status: DC
  Administered 2012-09-20 – 2012-09-21 (×2): 100 mg via ORAL
  Filled 2012-09-20: qty 1

## 2012-09-20 MED ORDER — LACTATED RINGERS IV SOLN: INTRAVENOUS | Status: DC

## 2012-09-20 MED ORDER — ALUM & MAG HYDROXIDE-SIMETH 200-200-20 MG/5ML PO SUSP: 30.0000 mL | ORAL | Status: DC | PRN

## 2012-09-20 MED ORDER — METOCLOPRAMIDE HCL 5 MG/ML IJ SOLN: 5.0000 mg | Freq: Three times a day (TID) | INTRAMUSCULAR | Status: DC | PRN

## 2012-09-20 MED ORDER — OXYCODONE HCL ER 20 MG PO T12A
20.0000 mg | EXTENDED_RELEASE_TABLET | Freq: Two times a day (BID) | ORAL | Status: DC
  Administered 2012-09-20 – 2012-09-21 (×3): 20 mg via ORAL
  Filled 2012-09-20 (×3): qty 1

## 2012-09-20 MED ORDER — HYDROMORPHONE HCL PF 1 MG/ML IJ SOLN
INTRAMUSCULAR | Status: AC
  Filled 2012-09-20: qty 1

## 2012-09-20 MED ORDER — METOCLOPRAMIDE HCL 10 MG PO TABS: 5.0000 mg | ORAL_TABLET | Freq: Three times a day (TID) | ORAL | Status: DC | PRN

## 2012-09-20 MED ORDER — SUCCINYLCHOLINE CHLORIDE 20 MG/ML IJ SOLN
INTRAMUSCULAR | Status: DC | PRN
  Administered 2012-09-20: 100 mg via INTRAVENOUS

## 2012-09-20 MED ORDER — ACETAMINOPHEN 325 MG PO TABS: 650.0000 mg | ORAL_TABLET | Freq: Four times a day (QID) | ORAL | Status: DC | PRN

## 2012-09-20 MED ORDER — ONDANSETRON HCL 4 MG/2ML IJ SOLN: 4.0000 mg | Freq: Four times a day (QID) | INTRAMUSCULAR | Status: DC | PRN

## 2012-09-20 MED ORDER — CEFAZOLIN SODIUM-DEXTROSE 2-3 GM-% IV SOLR
INTRAVENOUS | Status: AC
  Filled 2012-09-20: qty 50

## 2012-09-20 MED ORDER — PROPOFOL 10 MG/ML IV BOLUS
INTRAVENOUS | Status: DC | PRN
  Administered 2012-09-20: 180 mg via INTRAVENOUS

## 2012-09-20 MED ORDER — LACTATED RINGERS IV SOLN
INTRAVENOUS | Status: DC | PRN
  Administered 2012-09-20 (×3): via INTRAVENOUS

## 2012-09-20 MED ORDER — GLYCOPYRROLATE 0.2 MG/ML IJ SOLN
INTRAMUSCULAR | Status: DC | PRN
  Administered 2012-09-20: 0.6 mg via INTRAVENOUS

## 2012-09-20 MED ORDER — ADULT MULTIVITAMIN W/MINERALS CH
2.0000 | ORAL_TABLET | Freq: Every day | ORAL | Status: DC
  Administered 2012-09-20 – 2012-09-21 (×2): 2 via ORAL
  Filled 2012-09-20 (×2): qty 2

## 2012-09-20 MED ORDER — HYDROMORPHONE HCL PF 1 MG/ML IJ SOLN: 1.0000 mg | INTRAMUSCULAR | Status: DC | PRN

## 2012-09-20 MED ORDER — FENTANYL CITRATE 0.05 MG/ML IJ SOLN
INTRAMUSCULAR | Status: DC | PRN
  Administered 2012-09-20 (×3): 50 ug via INTRAVENOUS
  Administered 2012-09-20: 100 ug via INTRAVENOUS

## 2012-09-20 MED ORDER — HYDROMORPHONE HCL PF 1 MG/ML IJ SOLN
0.2500 mg | INTRAMUSCULAR | Status: DC | PRN
  Administered 2012-09-20 (×4): 0.5 mg via INTRAVENOUS

## 2012-09-20 MED ORDER — ASPIRIN EC 325 MG PO TBEC
325.0000 mg | DELAYED_RELEASE_TABLET | Freq: Two times a day (BID) | ORAL | Status: DC
  Administered 2012-09-21: 325 mg via ORAL
  Filled 2012-09-20 (×3): qty 1

## 2012-09-20 MED ORDER — MENTHOL 3 MG MT LOZG
1.0000 | LOZENGE | OROMUCOSAL | Status: DC | PRN
  Filled 2012-09-20: qty 9

## 2012-09-20 MED ORDER — PHENOL 1.4 % MT LIQD: 1.0000 | OROMUCOSAL | Status: DC | PRN

## 2012-09-20 MED ORDER — KETOROLAC TROMETHAMINE 15 MG/ML IJ SOLN
15.0000 mg | Freq: Four times a day (QID) | INTRAMUSCULAR | Status: AC
  Administered 2012-09-20 – 2012-09-21 (×4): 15 mg via INTRAVENOUS
  Filled 2012-09-20 (×4): qty 1

## 2012-09-20 MED ORDER — OXYCODONE HCL 5 MG PO TABS
5.0000 mg | ORAL_TABLET | ORAL | Status: DC | PRN
  Administered 2012-09-20 (×2): 5 mg via ORAL
  Filled 2012-09-20: qty 1
  Filled 2012-09-20: qty 2

## 2012-09-20 MED ORDER — CEFAZOLIN SODIUM-DEXTROSE 2-3 GM-% IV SOLR
2.0000 g | Freq: Four times a day (QID) | INTRAVENOUS | Status: AC
  Administered 2012-09-20 (×2): 2 g via INTRAVENOUS
  Filled 2012-09-20 (×2): qty 50

## 2012-09-20 MED ORDER — LISINOPRIL 40 MG PO TABS
40.0000 mg | ORAL_TABLET | Freq: Every day | ORAL | Status: DC
  Administered 2012-09-20 – 2012-09-21 (×2): 40 mg via ORAL
  Filled 2012-09-20 (×4): qty 1

## 2012-09-20 MED ORDER — ACETAMINOPHEN 650 MG RE SUPP: 650.0000 mg | Freq: Four times a day (QID) | RECTAL | Status: DC | PRN

## 2012-09-20 MED ORDER — ACETAMINOPHEN 10 MG/ML IV SOLN
INTRAVENOUS | Status: DC | PRN
  Administered 2012-09-20: 1000 mg via INTRAVENOUS

## 2012-09-20 MED ORDER — DEXAMETHASONE SODIUM PHOSPHATE 10 MG/ML IJ SOLN
INTRAMUSCULAR | Status: DC | PRN
  Administered 2012-09-20: 10 mg via INTRAVENOUS

## 2012-09-20 MED ORDER — ONDANSETRON HCL 4 MG/2ML IJ SOLN
INTRAMUSCULAR | Status: DC | PRN
  Administered 2012-09-20: 4 mg via INTRAVENOUS

## 2012-09-20 MED ORDER — ACETAMINOPHEN 10 MG/ML IV SOLN
INTRAVENOUS | Status: AC
  Filled 2012-09-20: qty 100

## 2012-09-20 MED ORDER — CEFAZOLIN SODIUM-DEXTROSE 2-3 GM-% IV SOLR
2.0000 g | INTRAVENOUS | Status: AC
  Administered 2012-09-20: 2 g via INTRAVENOUS

## 2012-09-20 MED ORDER — SODIUM CHLORIDE 0.9 % IV SOLN
INTRAVENOUS | Status: DC
  Administered 2012-09-20: 11:00:00 via INTRAVENOUS

## 2012-09-20 MED ORDER — ONDANSETRON HCL 4 MG PO TABS: 4.0000 mg | ORAL_TABLET | Freq: Four times a day (QID) | ORAL | Status: DC | PRN

## 2012-09-20 SURGICAL SUPPLY — 31 items
BAG ZIPLOCK 12X15 (MISCELLANEOUS) ×4 IMPLANT
BLADE SAW SGTL 18X1.27X75 (BLADE) ×2 IMPLANT
CLOTH BEACON ORANGE TIMEOUT ST (SAFETY) ×2 IMPLANT
DERMABOND ADVANCED (GAUZE/BANDAGES/DRESSINGS) ×1
DERMABOND ADVANCED .7 DNX12 (GAUZE/BANDAGES/DRESSINGS) ×1 IMPLANT
DRAPE C-ARM 42X72 X-RAY (DRAPES) ×2 IMPLANT
DRAPE STERI IOBAN 125X83 (DRAPES) ×2 IMPLANT
DRAPE U-SHAPE 47X51 STRL (DRAPES) ×6 IMPLANT
DRSG MEPILEX BORDER 4X8 (GAUZE/BANDAGES/DRESSINGS) ×2 IMPLANT
DURAPREP 26ML APPLICATOR (WOUND CARE) ×2 IMPLANT
ELECT BLADE TIP CTD 4 INCH (ELECTRODE) ×2 IMPLANT
ELECT REM PT RETURN 9FT ADLT (ELECTROSURGICAL) ×2
ELECTRODE REM PT RTRN 9FT ADLT (ELECTROSURGICAL) ×1 IMPLANT
FACESHIELD LNG OPTICON STERILE (SAFETY) ×8 IMPLANT
GLOVE BIO SURGEON STRL SZ7.5 (GLOVE) ×4 IMPLANT
GLOVE BIOGEL PI IND STRL 8 (GLOVE) ×2 IMPLANT
GLOVE BIOGEL PI INDICATOR 8 (GLOVE) ×2
GOWN STRL NON-REIN LRG LVL3 (GOWN DISPOSABLE) ×2 IMPLANT
GOWN STRL REIN XL XLG (GOWN DISPOSABLE) ×6 IMPLANT
KIT BASIN OR (CUSTOM PROCEDURE TRAY) ×2 IMPLANT
PACK TOTAL JOINT (CUSTOM PROCEDURE TRAY) ×2 IMPLANT
PADDING CAST COTTON 6X4 STRL (CAST SUPPLIES) ×2 IMPLANT
STAPLER VISISTAT 35W (STAPLE) IMPLANT
SUT ETHIBOND NAB CT1 #1 30IN (SUTURE) ×2 IMPLANT
SUT MNCRL AB 4-0 PS2 18 (SUTURE) ×2 IMPLANT
SUT VIC AB 2-0 CT1 27 (SUTURE) ×4
SUT VIC AB 2-0 CT1 TAPERPNT 27 (SUTURE) ×2 IMPLANT
SUT VLOC 180 0 24IN GS25 (SUTURE) ×2 IMPLANT
TOWEL OR 17X26 10 PK STRL BLUE (TOWEL DISPOSABLE) ×2 IMPLANT
TOWEL OR NON WOVEN STRL DISP B (DISPOSABLE) ×2 IMPLANT
TRAY FOLEY CATH 14FRSI W/METER (CATHETERS) ×2 IMPLANT

## 2012-09-20 NOTE — Transfer of Care (Signed)
Immediate Anesthesia Transfer of Care Note  Patient: Sergio Parker  Procedure(s) Performed: Procedure(s) (LRB) with comments: TOTAL HIP ARTHROPLASTY ANTERIOR APPROACH (Right)  Patient Location: PACU  Anesthesia Type:General  Level of Consciousness: awake  Airway & Oxygen Therapy: Patient Spontanous Breathing and Patient connected to face mask oxygen  Post-op Assessment: Report given to PACU RN and Post -op Vital signs reviewed and stable  Post vital signs: Reviewed and stable  Complications: No apparent anesthesia complications

## 2012-09-20 NOTE — Anesthesia Postprocedure Evaluation (Signed)
  Anesthesia Post-op Note  Patient: Sergio Parker  Procedure(s) Performed: Procedure(s) (LRB): TOTAL HIP ARTHROPLASTY ANTERIOR APPROACH (Right)  Patient Location: PACU  Anesthesia Type: General  Level of Consciousness: awake and alert   Airway and Oxygen Therapy: Patient Spontanous Breathing  Post-op Pain: mild  Post-op Assessment: Post-op Vital signs reviewed, Patient's Cardiovascular Status Stable, Respiratory Function Stable, Patent Airway and No signs of Nausea or vomiting  Last Vitals:  Filed Vitals:   09/20/12 1000  BP: 151/87  Pulse: 56  Temp: 37.2 C  Resp: 13    Post-op Vital Signs: stable   Complications: No apparent anesthesia complications

## 2012-09-20 NOTE — H&P (Signed)
TOTAL HIP ADMISSION H&P  Patient is admitted for right total hip arthroplasty.  Subjective:  Chief Complaint: right hip pain  HPI: Sergio Parker, 46 y.o. male, has a history of pain and functional disability in the right hip(s) due to avascular necrosis and patient has failed non-surgical conservative treatments for greater than 12 weeks to include NSAID's and/or analgesics and activity modification.  Onset of symptoms was gradual starting 5 years ago with gradually worsening course since that time.The patient noted no past surgery on the right hip(s).  Patient currently rates pain in the right hip at 8 out of 10 with activity. Patient has night pain, worsening of pain with activity and weight bearing, pain that interfers with activities of daily living and pain with passive range of motion. Patient has evidence of subchondral cysts by imaging studies. This condition presents safety issues increasing the risk of falls.  There is no current active infection.  Patient Active Problem List   Diagnosis Date Noted  . Avascular necrosis of bones of both hips 09/20/2012  . Avascular necrosis of hip 01/12/2012   Past Medical History  Diagnosis Date  . Hypertension     PCP Dr Wylene Simmer  . Arthritis     Past Surgical History  Procedure Date  . Mouth surgery   . Total hip arthroplasty 01/12/2012    Procedure: TOTAL HIP ARTHROPLASTY ANTERIOR APPROACH;  Surgeon: Kathryne Hitch, MD;  Location: [facility] ORS;  Service: Orthopedics;  Laterality: Left;  Left Total Hip Arthroplasty, Anterior Approach    Prescriptions prior to admission  Medication Sig Dispense Refill  . lisinopril (PRINIVIL,ZESTRIL) 40 MG tablet Take 40 mg by mouth daily before breakfast.      . aspirin EC 81 MG tablet Take 81 mg by mouth daily.      . Multiple Vitamin (MULTIVITAMIN WITH MINERALS) TABS Take 2 tablets by mouth daily.      . nabumetone (RELAFEN) 750 MG tablet Take 750 mg by mouth 2 (two) times daily.       No Known  Allergies  History  Substance Use Topics  . Smoking status: Former Smoker    Types: Cigars  . Smokeless tobacco: Current User    Types: Snuff     Comment: 1-2 cigars day years ago  . Alcohol Use: Yes     Comment: 6-12 beer week    History reviewed. No pertinent family history.   Review of Systems  Musculoskeletal: Positive for joint pain.  All other systems reviewed and are negative.    Objective:  Physical Exam  Constitutional: He is oriented to person, place, and time. He appears well-developed and well-nourished.  HENT:  Head: Normocephalic and atraumatic.  Eyes: EOM are normal. Pupils are equal, round, and reactive to light.  Neck: Normal range of motion.  Cardiovascular: Normal rate and regular rhythm.   Respiratory: Effort normal and breath sounds normal.  GI: Soft. Bowel sounds are normal.  Musculoskeletal:       Right hip: He exhibits decreased range of motion, bony tenderness and crepitus.  Neurological: He is alert and oriented to person, place, and time.  Skin: Skin is warm and dry.  Psychiatric: He has a normal mood and affect.    Vital signs in last 24 hours: Temp:  [97.6 F (36.4 C)] 97.6 F (36.4 C) (11/29 0532) Pulse Rate:  [72] 72  (11/29 0532) Resp:  [18] 18  (11/29 0532) BP: (145)/(96) 145/96 mmHg (11/29 0532) SpO2:  [100 %] 100 % (11/29  0532)  Labs:   Estimated Body mass index is 26.00 kg/(m^2) as calculated from the following:   Height as of 01/03/12: 5\' 8" (1.727 m).   Weight as of 01/03/12: 171 lb(77.565 kg).   Imaging Review Plain radiographs demonstrate moderate degenerative joint disease of the right hip(s). The bone quality appears to be excellent for age and reported activity level.  Assessment/Plan:  End stage arthritis, right hip(s)  The patient history, physical examination, clinical judgement of the provider and imaging studies are consistent with end stage degenerative joint disease of the right hip(s) and total hip  arthroplasty is deemed medically necessary. The treatment options including medical management, injection therapy, arthroscopy and arthroplasty were discussed at length. The risks and benefits of total hip arthroplasty were presented and reviewed. The risks due to aseptic loosening, infection, stiffness, dislocation/subluxation,  thromboembolic complications and other imponderables were discussed.  The patient acknowledged the explanation, agreed to proceed with the plan and consent was signed. Patient is being admitted for inpatient treatment for surgery, pain control, PT, OT, prophylactic antibiotics, VTE prophylaxis, progressive ambulation and ADL's and discharge planning.The patient is planning to be discharged home with home health services

## 2012-09-20 NOTE — Brief Op Note (Signed)
09/20/2012  9:19 AM  PATIENT:  Sergio Parker  46 y.o. male  PRE-OPERATIVE DIAGNOSIS:  avascular necrosis right hip  POST-OPERATIVE DIAGNOSIS:  avascular necrosis right hip  PROCEDURE:  Procedure(s) (LRB) with comments: TOTAL HIP ARTHROPLASTY ANTERIOR APPROACH (Right)  SURGEON:  Surgeon(s) and Role:    * Kathryne Hitch, MD - Primary    * Eldred Manges, MD - Assisting  PHYSICIAN ASSISTANT:   ASSISTANTS: Annell Greening, MD   ANESTHESIA:   general  EBL:  Total I/O In: 2500 [I.V.:2500] Out: 400 [Urine:200; Blood:200]  BLOOD ADMINISTERED:none  DRAINS: none   LOCAL MEDICATIONS USED:  NONE  SPECIMEN:  No Specimen  DISPOSITION OF SPECIMEN:  N/A  COUNTS:  YES  TOURNIQUET:  * No tourniquets in log *  DICTATION: .Other Dictation: Dictation Number [id number]  PLAN OF CARE: Admit to inpatient   PATIENT DISPOSITION:  PACU - hemodynamically stable.   Delay start of Pharmacological VTE agent (>24hrs) due to surgical blood loss or risk of bleeding: no

## 2012-09-20 NOTE — Evaluation (Signed)
Physical Therapy Evaluation Patient Details Name: Sergio Parker MRN: [id number] DOB: Oct 01, 1966 Today's Date: 09/20/2012 Time: 5784-6962 PT Time Calculation (min): 27 min  PT Assessment / Plan / Recommendation Clinical Impression  s/p right, direct anterior THA and will benefit formPT to maximize independence for return home with family support    PT Assessment  Patient needs continued PT services    Follow Up Recommendations  Home health PT;No PT follow up (depending on progress)    Does the patient have the potential to tolerate intense rehabilitation      Barriers to Discharge        Equipment Recommendations  None recommended by PT    Recommendations for Other Services     Frequency 7X/week    Precautions / Restrictions Precautions Precautions: None   Pertinent Vitals/Pain      Mobility  Bed Mobility Bed Mobility: Supine to Sit Supine to Sit: 5: Supervision Details for Bed Mobility Assistance: increased time Transfers Transfers: Sit to Stand;Stand to Sit Sit to Stand: 5: Supervision;4: Min guard Stand to Sit: 5: Supervision;4: Min guard Details for Transfer Assistance: cues for hand placement Ambulation/Gait Ambulation/Gait Assistance: 5: Supervision;4: Min guard Ambulation Distance (Feet): 80 Feet Assistive device: Rolling walker Gait Pattern: Step-to pattern;Step-through pattern General Gait Details: right LE slight internal rotation; pt stated LLE was that way initially after his left hip surgery    Shoulder Instructions     Exercises Total Joint Exercises Ankle Circles/Pumps: AROM;Both;5 reps   PT Diagnosis: Difficulty walking  PT Problem List: Decreased strength;Decreased activity tolerance;Decreased balance;Decreased mobility;Decreased knowledge of use of DME;Decreased range of motion PT Treatment Interventions: DME instruction;Gait training;Functional mobility training;Therapeutic activities;Therapeutic exercise;Patient/family education   PT  Goals Acute Rehab PT Goals PT Goal Formulation: With patient Time For Goal Achievement: 09/24/12 Potential to Achieve Goals: Good Pt will go Supine/Side to Sit: with modified independence PT Goal: Supine/Side to Sit - Progress: Goal set today Pt will go Sit to Supine/Side: with modified independence PT Goal: Sit to Supine/Side - Progress: Goal set today Pt will go Sit to Stand: Independently PT Goal: Sit to Stand - Progress: Goal set today Pt will go Stand to Sit: Independently PT Goal: Stand to Sit - Progress: Goal set today Pt will Ambulate: >150 feet;with modified independence;with least restrictive assistive device PT Goal: Ambulate - Progress: Goal set today Pt will Perform Home Exercise Program: with supervision, verbal cues required/provided PT Goal: Perform Home Exercise Program - Progress: Goal set today  Visit Information  Last PT Received On: 09/20/12 Assistance Needed: +1    Subjective Data  Subjective: It is tight Patient Stated Goal: Independence   Prior Functioning  Home Living Lives With: Spouse Type of Home: House Home Access: Stairs to enter Entrance Stairs-Number of Steps: 1 Home Layout: Two level;Able to live on main level with bedroom/bathroom Home Adaptive Equipment: Walker - rolling;Straight cane Prior Function Level of Independence: Independent Able to Take Stairs?: Yes Driving: Yes Communication Communication: No difficulties    Cognition  Overall Cognitive Status: Appears within functional limits for tasks assessed/performed Arousal/Alertness: Awake/alert Orientation Level: Appears intact for tasks assessed Behavior During Session: [facility] for tasks performed    Extremity/Trunk Assessment Right Upper Extremity Assessment RUE ROM/Strength/Tone: [facility] for tasks assessed Left Upper Extremity Assessment LUE ROM/Strength/Tone: WFL for tasks assessed Right Lower Extremity Assessment RLE ROM/Strength/Tone: Deficits RLE ROM/Strength/Tone Deficits:  AAROM WFL; 3/5 hip/knee due to soreness; ankle WFL Left Lower Extremity Assessment LLE ROM/Strength/Tone: [facility] for tasks assessed   Balance  End of Session PT - End of Session Equipment Utilized During Treatment: Gait belt Activity Tolerance: Patient tolerated treatment well Patient left: in chair;with call bell/phone within reach;with family/visitor present Nurse Communication: Mobility status  GP     [facility] 09/20/2012, 3:53 PM

## 2012-09-21 LAB — CBC
HCT: 33.1 % — ABNORMAL LOW (ref 39.0–52.0)
MCHC: 33.2 g/dL (ref 30.0–36.0)
MCV: 88 fL (ref 78.0–100.0)
Platelets: 182 10*3/uL (ref 150–400)
RDW: 13 % (ref 11.5–15.5)
WBC: 9.3 10*3/uL (ref 4.0–10.5)

## 2012-09-21 LAB — BASIC METABOLIC PANEL
BUN: 9 mg/dL (ref 6–23)
Chloride: 103 mEq/L (ref 96–112)
Creatinine, Ser: 0.84 mg/dL (ref 0.50–1.35)
GFR calc Af Amer: >90 mL/min (ref >90)
GFR calc non Af Amer: >90 mL/min (ref >90)
Potassium: 4.4 mEq/L (ref 3.5–5.1)

## 2012-09-21 MED ORDER — HYDROCODONE-ACETAMINOPHEN 5-325 MG PO TABS: 1.0000 | ORAL_TABLET | Freq: Four times a day (QID) | ORAL | Status: DC | PRN

## 2012-09-21 MED ORDER — ASPIRIN 325 MG PO TBEC: 325.0000 mg | DELAYED_RELEASE_TABLET | Freq: Two times a day (BID) | ORAL | Status: DC

## 2012-09-21 MED ORDER — METHOCARBAMOL 500 MG PO TABS: 500.0000 mg | ORAL_TABLET | Freq: Four times a day (QID) | ORAL | Status: DC | PRN

## 2012-09-21 NOTE — Progress Notes (Signed)
Discharged from floor via w/c, spouse with pt. No changes in assessment. Sergio Parker   

## 2012-09-21 NOTE — Plan of Care (Signed)
Problem: Phase I Progression Outcomes Goal: Pain controlled with appropriate interventions Outcome: Progressing Pt receiving scheduled oxycontin and toradol as ordered, has required few prns in addition to.

## 2012-09-21 NOTE — Progress Notes (Signed)
Subjective: 1 Day Post-Op Procedure(s) (LRB): TOTAL HIP ARTHROPLASTY ANTERIOR APPROACH (Right) Awake, alert Ox4. Tolerating po diet. Foley d/ced and has voided without difficulty Questions whether he may go home today. Therapy not seen yet. IVF still intact. Did walk yesterday in the afternoon post op. Patient reports pain as moderate.    Objective:   VITALS:  Temp:  [97.1 F (36.2 C)-98.9 F (37.2 C)] 97.8 F (36.6 C) (11/30 0617) Pulse Rate:  [61-82] 62  (11/30 0617) Resp:  [18-19] 18  (11/30 0758) BP: (111-162)/(64-94) 129/77 mmHg (11/30 0617) SpO2:  [97 %-100 %] 100 % (11/30 0758) Weight:  [77.565 kg (171 lb)] 77.565 kg (171 lb) (11/29 2059)  Neurologically intact ABD soft Neurovascular intact Sensation intact distally Intact pulses distally Dorsiflexion/Plantar flexion intact Incision: dressing C/D/I   LABS  Basename 09/21/12 0551  HGB 11.0*  WBC 9.3  PLT 182    Basename 09/21/12 0551  NA 139  K 4.4  CL 103  CO2 28  BUN 9  CREATININE 0.84  GLUCOSE 116*   No results found for this basename: LABPT:2,INR:2 in the last 72 hours   Assessment/Plan: 1 Day Post-Op Procedure(s) (LRB): TOTAL HIP ARTHROPLASTY ANTERIOR APPROACH (Right)  Advance diet Up with therapy D/C IV fluids Plan for discharge tomorrow Discharge home with home health  Tyerra Loretto E 09/21/2012, 10:08 AM

## 2012-09-21 NOTE — Progress Notes (Signed)
Physical Therapy Treatment Patient Details Name: Sergio Parker MRN: [id number] DOB: [birth year] Today's Date: 09/21/2012 Time: 1308-6578 PT Time Calculation (min): 18 min  PT Assessment / Plan / Recommendation Comments on Treatment Session  POD # 1 R Direct Anterior THR.  Pt progressing very well and plans to D/C to home today. Pt given handout on TE's, instructed on freq/reps and use of ICE.    Follow Up Recommendations  Home health PT;No PT follow up (depending on pt progress/request)     Does the patient have the potential to tolerate intense rehabilitation     Barriers to Discharge        Equipment Recommendations  None recommended by PT    Recommendations for Other Services    Frequency 7X/week   Plan Discharge plan remains appropriate    Precautions / Restrictions Precautions Precautions: None Precaution Comments: Direct Anterior Approach THR Restrictions Weight Bearing Restrictions: No RLE Weight Bearing: Weight bearing as tolerated    Pertinent Vitals/Pain C/o "tightness' ICE applied    Mobility  Bed Mobility Bed Mobility: Not assessed Details for Bed Mobility Assistance: Pt OOB in recliner Transfers Transfers: Sit to Stand;Stand to Sit Sit to Stand: 6: Modified independent (Device/Increase time);From chair/3-in-1 Stand to Sit: To chair/3-in-1;6: Modified independent (Device/Increase time) Details for Transfer Assistance: good use of hands and safety cognition Ambulation/Gait Ambulation/Gait Assistance: 6: Modified independent (Device/Increase time) Ambulation Distance (Feet): 250 Feet Assistive device: Rolling walker Ambulation/Gait Assistance Details: good alternating gait with good safety cognition Gait Pattern: Step-through pattern Stairs: Yes Stairs Assistance: 6: Modified independent (Device/Increase time) Stair Management Technique: No rails;Forwards;With walker Number of Stairs: 1     Exercises Total Joint Exercises Ankle Circles/Pumps:  AROM;Both;10 reps;Seated Hip ABduction/ADduction: AROM;Right;10 reps;Standing Knee Flexion: AROM;Right;10 reps;Standing Standing Hip Extension: AROM;Right;10 reps;Standing   PT Goals    Visit Information  Last PT Received On: 09/21/12    Subjective Data  Patient Stated Goal: home   Cognition       Balance     End of Session PT - End of Session Equipment Utilized During Treatment: Gait belt Activity Tolerance: Patient tolerated treatment well Patient left: in chair;with call bell/phone within reach (ICE to R hip)   Felecia Shelling  PTA [facility]  Acute  Rehab Pager     [phone]

## 2012-09-21 NOTE — Progress Notes (Signed)
Home health PT, referral to [facility]

## 2012-09-21 NOTE — Progress Notes (Signed)
OT Note:  Pt screened for OT.  He has had L THA previously and has no OT needs.  He has a high commode at home.  [place], OTR/L [phone] 09/21/2012

## 2012-09-23 NOTE — Op Note (Signed)
NAMEJAKEN, FREGIA NO.:  [id number]  MEDICAL RECORD NO.:  [id number]  LOCATION:                                 FACILITY:  PHYSICIAN:  Vanita Panda. Magnus Ivan, M.D.DATE OF BIRTH:  1966-05-27  DATE OF PROCEDURE:  09/20/2012 DATE OF DISCHARGE:                              OPERATIVE REPORT   PREOPERATIVE DIAGNOSIS:  Right hip avascular necrosis.  POSTOPERATIVE DIAGNOSIS:  Right hip avascular necrosis.  PROCEDURE:  Right total hip arthroplasty through direct anterior approach.  IMPLANTS:  [employer] Gription acetabular component size 50, size 32+4 neutral polyethylene liner, size 9 Corail femoral component with standard offset, size 32+ 1 ceramic hip ball.  SURGEON:  Vanita Panda. Magnus Ivan, M.D.  ASSISTANT:  Veverly Fells. Ophelia Charter, M.D.  ANESTHESIA:  General.  ANTIBIOTICS:  A 2 g of IV Ancef.  BLOOD LOSS:  Less than 200 mL.  COMPLICATIONS:  None.  INDICATIONS:  Sergio Parker is a 46 year old well known to me.  He has bilateral hip avascular necrosis and underwent a successful left total hip arthroplasty later this year.  His AV and pain has been worsening on his right hip now.  He wished to proceed with a right total hip arthroplasty.  He  understands in detail the risks and benefits of surgery and what this involves and does wish to proceed.  PROCEDURE DESCRIPTION:  After informed consent was obtained, appropriate right hip was marked, was brought to the operating room.  General anesthesia was obtained while he was on a stretcher.  Foley catheter was placed and then both feet traction boots applied to them.  His right hip was then prepped and draped with DuraPrep and sterile drapes.  A time- out was called to identify the correct patient, correct right hip.  I then made an incision just inferior and posterior to the anterior superior iliac spine and carried this obliquely down the leg.  I dissected down to the tensor fascia lata and tensor fascia was  divided obliquely.  I thus proceeded with a direct anterior approach to the hip. Cobra retractor was placed around the lateral neck and one around the medial neck.  I cauterized the lateral femoral circumflex vessels.  I opened up the hip capsule and found a large effusion.  I put the Cobra retractors within the hip capsule.  I then made femoral neck cut just proximal to the lesser trochanter with an oscillating saw and completed this with an osteotome.  I then used a corkscrew guide in the femoral head and removed this in entirety and found complete collapse of the cartilage of the femoral head.  I then placed a bent Hohmann medially and a curved retractor laterally.  I cleaned the acetabulum and debris including acetabular labrum and began reaming in 2 mm increments from size 42 up to a size 50 with all reamers placed under direct visualization and the last reamer also placed under direct fluoroscopy, so we could obtain my depth of reaming, my inclination and anteversion. I then placed a real size 50 acetabular component matching his other side.  We placed the real size 32+ 4 neutral polyethylene liner. Attention was then turned to the femur with the leg  externally rotated to 90 degrees, extended and adducted.  I gained access to the femoral canal.  I placed a Mueller retractor medially and a bent Hohmann around the greater trochanter.  I released the lateral capsule and then used a box cutting guide to open the femoral canal and a rongeur to lateralize. I then broached from a size 8 and a size 9 broach.  The size 9 broach was stable.  I trialed a standard neck and a 32+ 1 hip ball.  We brought the leg back over and up once traction internal rotation reduced the hip.  He had stable internal external rotation.  Stable Shuck and his leg lengths were measured exactly equal and under fluoroscopy.  I then dislocated the hip and removed the trial components.  I then placed the real Corail  femoral component with standard offset, size 9 and the real 32+ 1 ceramic hip ball.  We reduced this back and the acetabulum was entirely stable.  We then copiously irrigated the soft tissue with normal saline solution.  I closed the joint capsule with interrupted #1 Ethibond suture followed by running 0 V-Loc in the tensor fascia lata, 2- 0 Vicryl in subcutaneous tissue, and 4-0 Monocryl subcuticular tissue and Dermabond on the skin.  Well-padded sterile dressing was applied. He was taken off the Sergio Parker table, awakened, extubated and taken to recovery room in stable condition.  All final counts were correct. There were no complications noted.     Vanita Panda. Magnus Ivan, M.D.     CYB/MEDQ  D:  09/20/2012  T:  09/20/2012  Job:  [id number]

## 2012-09-23 NOTE — Discharge Summary (Signed)
Patient ID: Sergio Parker MRN: [id number] DOB/AGE: [birth year] 46 y.o.  Admit date: 09/20/2012 Discharge date: 09/23/2012  Admission Diagnoses:  Principal Problem:  *Avascular necrosis of bones of both hips   Discharge Diagnoses:  Same  Past Medical History  Diagnosis Date  . Hypertension     PCP Dr Wylene Simmer  . Arthritis     Surgeries: Procedure(s): TOTAL HIP ARTHROPLASTY ANTERIOR APPROACH on 09/20/2012   Consultants:    Discharged Condition: Improved  Hospital Course: Sergio Parker is an 46 y.o. male who was admitted 09/20/2012 for operative treatment ofAvascular necrosis of bones of both hips. Patient has severe unremitting pain that affects sleep, daily activities, and work/hobbies. After pre-op clearance the patient was taken to the operating room on 09/20/2012 and underwent  Procedure(s): TOTAL HIP ARTHROPLASTY ANTERIOR APPROACH.    Patient was given perioperative antibiotics: Anti-infectives     Start     Dose/Rate Route Frequency Ordered Stop   09/20/12 1400   ceFAZolin (ANCEF) IVPB 2 g/50 mL premix        2 g 100 mL/hr over 30 Minutes Intravenous Every 6 hours 09/20/12 1056 09/20/12 2009   09/20/12 0532   ceFAZolin (ANCEF) IVPB 2 g/50 mL premix        2 g 100 mL/hr over 30 Minutes Intravenous 60 min pre-op 09/20/12 0532 09/20/12 0727           Patient was given sequential compression devices, early ambulation, and chemoprophylaxis to prevent DVT.  Patient benefited maximally from hospital stay and there were no complications.    Recent vital signs: No data found.    Recent laboratory studies:  [facility] 09/21/12 0551  WBC 9.3  HGB 11.0*  HCT 33.1*  PLT 182  NA 139  K 4.4  CL 103  CO2 28  BUN 9  CREATININE 0.84  GLUCOSE 116*  INR --  CALCIUM 8.9     Discharge Medications:     Medication List     As of 09/23/2012  5:51 PM    TAKE these medications         aspirin EC 81 MG tablet   Take 81 mg by mouth daily.      aspirin 325  MG EC tablet   Take 1 tablet (325 mg total) by mouth 2 (two) times daily.      HYDROcodone-acetaminophen 5-325 MG per tablet   Commonly known as: NORCO/VICODIN   Take 1-2 tablets by mouth every 6 (six) hours as needed for pain. MAXIMUM TOTAL ACETAMINOPHEN DOSE IS 4000 MG PER DAY      lisinopril 40 MG tablet   Commonly known as: PRINIVIL,ZESTRIL   Take 40 mg by mouth daily before breakfast.      methocarbamol 500 MG tablet   Commonly known as: ROBAXIN   Take 1 tablet (500 mg total) by mouth every 6 (six) hours as needed.      multivitamin with minerals Tabs   Take 2 tablets by mouth daily.      nabumetone 750 MG tablet   Commonly known as: RELAFEN   Take 750 mg by mouth 2 (two) times daily.        Diagnostic Studies: Dg Hip Complete Right  09/20/2012  *RADIOLOGY REPORT*  Clinical Data: Right hip replacement.  RIGHT HIP - COMPLETE 2+ VIEW  Comparison: MRI 08/01/2012 and 01/12/2012  Findings: Two fluoroscopic images demonstrate a right hip arthroplasty.  The entire right femoral stem is visualized and no evidence for a periprosthetic fracture.  Again noted is a left hip arthroplasty.  Visualized pelvic ring is intact.  IMPRESSION: Right hip arthroplasty without complicating features.   Original Report Authenticated By: Richarda Overlie, M.D.    Dg Pelvis Portable  09/20/2012  *RADIOLOGY REPORT*  Clinical Data: Postop right hip.  PORTABLE PELVIS  Comparison: 09/20/2012.  Findings: Changes of bilateral hip replacements.  No hardware or bony complicating feature.  Normal AP alignment.  SI joints are symmetric and unremarkable.  IMPRESSION: Bilateral hip replacements.  No acute or complicating feature.   Original Report Authenticated By: Charlett Nose, M.D.    Dg Hip Portable 1 View Right  09/20/2012  *RADIOLOGY REPORT*  Clinical Data: Postop right hip.  PORTABLE RIGHT HIP - 1 VIEW  Comparison: None.  Findings: Changes of right hip replacement.  No hardware or bony complicating feature.  Normal AP  alignment.  IMPRESSION: Right hip replacement.  No complicating feature.   Original Report Authenticated By: Charlett Nose, M.D.    Dg C-arm 1-60 Min-no Report  09/20/2012  CLINICAL DATA: anterior hip   C-ARM 1-60 MINUTES  Fluoroscopy was utilized by the requesting physician.  No radiographic  interpretation.      Disposition: 01-Home or Self Care      Discharge Orders    Future Orders Please Complete By Expires   Diet - low sodium heart healthy      Call MD / Call 911      Comments:   If you experience chest pain or shortness of breath, CALL 911 and be transported to the hospital emergency room.  If you develope a fever above 101 F, pus (white drainage) or increased drainage or redness at the wound, or calf pain, call your surgeon's office.   Constipation Prevention      Comments:   Drink plenty of fluids.  Prune juice may be helpful.  You may use a stool softener, such as Colace (over the counter) 100 mg twice a day.  Use MiraLax (over the counter) for constipation as needed.   Increase activity slowly as tolerated      Discharge instructions      Comments:   Keep knee incision dry for 5 days post op then may wet while bathing. Therapy daily . Call if fever or chills or increased drainage. Go to ER if acutely short of breath or call for ambulance. Return for follow up in 2 weeks. May full weight bear on the surgical leg unless told otherwise. In house walking for first 2 weeks.   Driving restrictions      Comments:   No driving for 6 weeks   Lifting restrictions      Comments:   No lifting for 4 weeks   Follow the hip precautions as taught in Physical Therapy      Change dressing      Comments:   You may change your dressing on 09/24/2012, then change the dressing daily with sterile 4 x 4 inch gauze dressing and paper tape.  You may clean the incision with alcohol prior to redressing      Follow-up Information    Follow up with Kathryne Hitch, MD. In 2 weeks.    Contact information:   [address] [place] [address] [phone]           Signed: Kathryne Hitch 09/23/2012, 5:51 PM

## 2012-09-24 ENCOUNTER — Encounter (HOSPITAL_COMMUNITY): Payer: Self-pay | Admitting: Orthopaedic Surgery

## 2015-11-11 ENCOUNTER — Encounter: Payer: Self-pay | Admitting: Gastroenterology

## 2015-11-25 ENCOUNTER — Encounter: Payer: Self-pay | Admitting: Neurology

## 2015-11-25 ENCOUNTER — Ambulatory Visit (INDEPENDENT_AMBULATORY_CARE_PROVIDER_SITE_OTHER): Payer: BLUE CROSS/BLUE SHIELD | Admitting: Neurology

## 2015-11-25 VITALS — BP 124/84 | HR 82 | Resp 20 | Ht 67.0 in | Wt 179.0 lb

## 2015-11-25 DIAGNOSIS — R0683 Snoring: Secondary | ICD-10-CM | POA: Diagnosis not present

## 2015-11-25 DIAGNOSIS — M87052 Idiopathic aseptic necrosis of left femur: Secondary | ICD-10-CM | POA: Diagnosis not present

## 2015-11-25 DIAGNOSIS — E663 Overweight: Secondary | ICD-10-CM | POA: Diagnosis not present

## 2015-11-25 DIAGNOSIS — M87051 Idiopathic aseptic necrosis of right femur: Secondary | ICD-10-CM

## 2015-11-25 DIAGNOSIS — G4733 Obstructive sleep apnea (adult) (pediatric): Secondary | ICD-10-CM | POA: Diagnosis not present

## 2015-11-25 NOTE — Progress Notes (Signed)
SLEEP MEDICINE CLINIC   Provider:  Larey Seat, M D  Referring Provider: Haywood Pao, MD Primary Care Physician:  Haywood Pao, MD  Chief Complaint  Patient presents with  . New Patient (Initial Visit)    snoring, wife thinks he needs a sleep study, rm 10, alone    HPI:  Sergio Parker is a 50 y.o. male , seen here as a referral  from Dr. Osborne Casco for a sleep evaluation,  Chief complaint according to patient : " my wife sends me "   Mr. Rogus describes that he has been diagnosed with bruxism, his dentist has manufactured a device for him to take the strain off his teeth. He notices a difference in pain and soreness if he used the device or not but it seems not to affect his level of snoring. Mrs. Bogie states that she has to go to bed first to be asleep because she cannot fall asleep when her husband is already snoring. She sometimes spends at night and another bedroom. The dog and its owner are going to bed together, and the dog seems to be nor ricing his owners breathing to stop , to gasp and to have crescendo snoring. Mr. Jeffres also has shoulder pain on the right and this was the sleep position her snored the least in.  He faces to his wife when he snores. There are evenings when his wife is already in bed watching TV and she can hear her husband snoring on the lower floor. When he drank alcohol he snores louder. He has a crowded lower jaw.  Has the above named device for bruxism, but this  has no snoring treatment potential. He also is a habitual mouth breather, nose  congested.   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 Lost ROM. Now has shoulder pain.  Sleep habits are as follows: He falls asleep promptly-He usually goes to bed between 9 and 10 PM, he usually watches some TV but only for couple of minutes or so. The bedroom is otherwise cool, quiet and dark. The dog sleeps in the same bed. He wakes only when his shoulder pain is  present, rarely for a bathroom break.  Rising at 5.30 he will get 6-7 hours of sleep. He wakes spontaneously but does not necessarily rely on an alarm. He does not take breakfast, he commutes 45 minutes, he has no daylight at his work space. He works for [employer].   Sleep medical history and family sleep history:  His father and his brother pulse are loud snorer since seemed to have potential for sleep apnea. He has no personal history of sleepwalking or night terrors.   Social history; snuff user, occasional smoker, 10 drinks a week. Caffeine use 3 a week.   Review of Systems: Out of a complete 14 system review, the patient complains of only the following symptoms, and all other reviewed systems are negative. How likely are you to doze in the following situations: 0 = not likely, 1 = slight chance, 2 = moderate chance, 3 = high chance  Sitting and Reading? 1 Watching Television?2 Sitting inactive in a public place (theater or meeting)?1 Lying down in the afternoon when circumstances permit?0 Sitting and talking to someone?1 Sitting quietly after lunch without alcohol?0 In a car, while stopped for a few minutes in traffic?0 As a passenger in a car for an hour without a break?0  Total = 5  Epworth score 5 ,  Fatigue severity score 25  , depression score n/a    Social History   Social History  . Marital Status: Married    Spouse Name: N/A  . Number of Children: N/A  . Years of Education: N/A   Occupational History  . Not on file.   Social History Main Topics  . Smoking status: Former Smoker    Types: Cigars  . Smokeless tobacco: Current User    Types: Snuff     Comment: 1-2 cigars day years ago  . Alcohol Use: Yes     Comment: 6-12 beer week  . Drug Use: No  . Sexual Activity: No   Other Topics Concern  . Not on file   Social History Narrative   Drinks about 1 cup of caffeine daily.    Family History  Problem Relation Age of Onset  . Hypertension Mother   .  Hypertension Father     Past Medical History  Diagnosis Date  . Hypertension     PCP Dr Osborne Casco  . Arthritis   . Hypertriglyceridemia   . Obesity   . Avascular necrosis ([place])   . Microalbuminuria   . ED (erectile dysfunction)     Past Surgical History  Procedure Laterality Date  . Mouth surgery    . Total hip arthroplasty  01/12/2012    Procedure: TOTAL HIP ARTHROPLASTY ANTERIOR APPROACH;  Surgeon: Mcarthur Rossetti, MD;  Location: [facility];  Service: Orthopedics;  Laterality: Left;  Left Total Hip Arthroplasty, Anterior Approach  . Total hip arthroplasty  09/20/2012    Procedure: TOTAL HIP ARTHROPLASTY ANTERIOR APPROACH;  Surgeon: Mcarthur Rossetti, MD;  Location: [facility];  Service: Orthopedics;  Laterality: Right;    Current Outpatient Prescriptions  Medication Sig Dispense Refill  . lisinopril (PRINIVIL,ZESTRIL) 40 MG tablet Take 40 mg by mouth daily before breakfast.     No current facility-administered medications for this visit.    Allergies as of 11/25/2015  . (No Known Allergies)    Vitals: BP 124/84 mmHg  Pulse 82  Resp 20  Ht 5\' 7"  (1.702 m)  Wt 179 lb (81.194 kg)  BMI 28.03 kg/m2 Last Weight:  Wt Readings from Last 1 Encounters:  11/25/15 179 lb (81.194 kg)   [id number] mass index is 28.03 kg/(m^2).     Last Height:   Ht Readings from Last 1 Encounters:  11/25/15 5\' 7"  (1.702 m)    Physical exam:  General: The patient is awake, alert and appears not in acute distress. The patient is well groomed. Head: Normocephalic, atraumatic. Neck is supple. Mallampati 3,  neck circumference:17 Nasal airflow restricted ,congested  TMJ is not  evident . Retrognathia is not seen.  Cardiovascular:  Regular rate and rhythm , without  murmurs or carotid bruit, and without distended neck veins. Respiratory: Lungs are clear to auscultation. Skin:  Without evidence of edema, or rash Trunk: BMI is elevated . Neurologic exam : The patient is awake and alert, oriented  to place and time.   Memory subjective  described as intact.  Attention span & concentration ability appears normal.  Speech is fluent,  without  dysarthria, dysphonia or aphasia.  Mood and affect are appropriate.  Cranial nerves: Pupils are equal and briskly reactive to light. Funduscopic exam without  evidence of pallor or edema.  Extraocular movements  in vertical and horizontal planes intact and without nystagmus. Visual fields by finger perimetry are intact. Hearing to finger rub intact.   Facial sensation intact to fine touch.  Facial motor strength is symmetric and tongue and uvula move midline. Shoulder shrug was symmetrical.   Motor exam:   Normal tone, muscle bulk and symmetric strength in all extremities.  Sensory:  Fine touch, pinprick and vibration were tested in all extremities. Proprioception tested in the upper extremities was normal.  Coordination: Rapid alternating movements in the fingers/hands was normal. Finger-to-nose maneuver  normal without evidence of ataxia, dysmetria or tremor.  Gait and station: Patient walks without assistive device and is able unassisted to climb up to the exam table. Strength within normal limits.   Deep tendon reflexes: in the  upper and lower extremities are symmetric and intact. Babinski maneuver response is downgoing.  The patient was advised of the nature of the diagnosed sleep disorder , the treatment options and risks for general a health and wellness arising from not treating the condition.  I spent more than 40 minutes of face to face time with the patient. Greater than 50% of time was spent in counseling and coordination of care. We have discussed the diagnosis and differential and I answered the patient's questions.     Assessment:  After physical and neurologic examination, review of laboratory studies,  Personal review of imaging studies, reports of other /same  Imaging studies ,  Results of polysomnography/ neurophysiology  testing and pre-existing records as far as provided in visit., my assessment is   1) patient with joint pain related restricition of movement, difficulties to find a comfortable sleep position, which no excludes sleeping on his right side. If he finds his sleep interrupted it is because of discomfort and joint pain.  2) the patient snores loudly and has been witnessed to stop in his breathing and clinically as well as anatomically has a lot of risk factors for obstructive sleep apnea plus a positive family history. He does not wake up with headaches but with a very dry mouth. He has an enlarged uvula but does not lift completely above the tongue ground.  3) body mass index is 28 which is elevated but not obese. I recommend also to reduce the alcohol intake or advance it to at least 3 hours before bedtime so that snoring and apnea would not be further exacerbated.    Plan:  Treatment plan and additional workup :  Split study , no CO2 needed.   [employer]   RV after sleep study   Larey Seat MD  11/25/2015   CC: Haywood Pao, [address] [place], [place] [address]

## 2015-12-03 ENCOUNTER — Ambulatory Visit (INDEPENDENT_AMBULATORY_CARE_PROVIDER_SITE_OTHER): Payer: BLUE CROSS/BLUE SHIELD | Admitting: Neurology

## 2015-12-03 DIAGNOSIS — M87052 Idiopathic aseptic necrosis of left femur: Secondary | ICD-10-CM

## 2015-12-03 DIAGNOSIS — G4733 Obstructive sleep apnea (adult) (pediatric): Secondary | ICD-10-CM

## 2015-12-03 DIAGNOSIS — M87051 Idiopathic aseptic necrosis of right femur: Secondary | ICD-10-CM

## 2015-12-03 DIAGNOSIS — E663 Overweight: Secondary | ICD-10-CM

## 2015-12-03 DIAGNOSIS — R0683 Snoring: Secondary | ICD-10-CM

## 2015-12-04 NOTE — Sleep Study (Signed)
Please see the scanned sleep study interpretation located in the Procedure tab within the Chart Review section. 

## 2015-12-07 ENCOUNTER — Telehealth: Payer: Self-pay

## 2015-12-07 NOTE — Telephone Encounter (Signed)
I spoke to pt regarding his sleep study results. I advised him that his sleep study showed that he does have osa and Dr. Brett Fairy recommends starting a cpap. Pt stated that he woke up just as much with the cpap on as he did with it off, so he is really unsure about starting a cpap. He wants to discuss the sleep study results further with Dr. Brett Fairy. I offered him an office visit to discuss further with Dr. Brett Fairy before deciding on cpap. Pt stated that he would call us back tomorrow to get the office visit with Dr. Brett Fairy scheduled.  When pt calls back, please schedule him for a 30 minute office visit with Dr. Brett Fairy.

## 2015-12-13 ENCOUNTER — Ambulatory Visit (AMBULATORY_SURGERY_CENTER): Payer: Self-pay | Admitting: *Deleted

## 2015-12-13 VITALS — Ht 67.0 in | Wt 183.0 lb

## 2015-12-13 DIAGNOSIS — Z1211 Encounter for screening for malignant neoplasm of colon: Secondary | ICD-10-CM

## 2015-12-13 NOTE — Progress Notes (Signed)
Patient denies any allergies to eggs or soy. Patient denies any problems with anesthesia/sedation. Patient denies any oxygen use at home and does not take any diet/weight loss medications. Patient declined EMMI education. 

## 2015-12-27 ENCOUNTER — Encounter: Payer: Self-pay | Admitting: Gastroenterology

## 2015-12-27 ENCOUNTER — Ambulatory Visit (AMBULATORY_SURGERY_CENTER): Payer: BLUE CROSS/BLUE SHIELD | Admitting: Gastroenterology

## 2015-12-27 VITALS — BP 119/78 | HR 73 | Temp 96.9°F | Resp 14 | Ht 67.0 in | Wt 183.0 lb

## 2015-12-27 DIAGNOSIS — D128 Benign neoplasm of rectum: Secondary | ICD-10-CM

## 2015-12-27 DIAGNOSIS — Z1211 Encounter for screening for malignant neoplasm of colon: Secondary | ICD-10-CM | POA: Diagnosis not present

## 2015-12-27 DIAGNOSIS — K621 Rectal polyp: Secondary | ICD-10-CM

## 2015-12-27 DIAGNOSIS — D129 Benign neoplasm of anus and anal canal: Secondary | ICD-10-CM

## 2015-12-27 DIAGNOSIS — D125 Benign neoplasm of sigmoid colon: Secondary | ICD-10-CM | POA: Diagnosis not present

## 2015-12-27 MED ORDER — SODIUM CHLORIDE 0.9 % IV SOLN: 500.0000 mL | INTRAVENOUS | Status: DC

## 2015-12-27 NOTE — Patient Instructions (Signed)
YOU HAD AN ENDOSCOPIC PROCEDURE TODAY AT THE [facility]:   Refer to the procedure report that was given to you for any specific questions about what was found during the examination.  If the procedure report does not answer your questions, please call your gastroenterologist to clarify.  If you requested that your care partner not be given the details of your procedure findings, then the procedure report has been included in a sealed envelope for you to review at your convenience later.  YOU SHOULD EXPECT: Some feelings of bloating in the abdomen. Passage of more gas than usual.  Walking can help get rid of the air that was put into your GI tract during the procedure and reduce the bloating. If you had a lower endoscopy (such as a colonoscopy or flexible sigmoidoscopy) you may notice spotting of blood in your stool or on the toilet paper. If you underwent a bowel prep for your procedure, you may not have a normal bowel movement for a few days.  Please Note:  You might notice some irritation and congestion in your nose or some drainage.  This is from the oxygen used during your procedure.  There is no need for concern and it should clear up in a day or so.  SYMPTOMS TO REPORT IMMEDIATELY:   Following lower endoscopy (colonoscopy or flexible sigmoidoscopy):  Excessive amounts of blood in the stool  Significant tenderness or worsening of abdominal pains  Swelling of the abdomen that is new, acute  Fever of 100F or higher   For urgent or emergent issues, a gastroenterologist can be reached at any hour by calling [phone].   DIET: Your first meal following the procedure should be a small meal and then it is ok to progress to your normal diet. Heavy or fried foods are harder to digest and may make you feel nauseous or bloated.  Likewise, meals heavy in dairy and vegetables can increase bloating.  Drink plenty of fluids but you should avoid alcoholic beverages for 24  hours.  ACTIVITY:  You should plan to take it easy for the rest of today and you should NOT DRIVE or use heavy machinery until tomorrow (because of the sedation medicines used during the test).    FOLLOW UP: Our staff will call the number listed on your records the next business day following your procedure to check on you and address any questions or concerns that you may have regarding the information given to you following your procedure. If we do not reach you, we will leave a message.  However, if you are feeling well and you are not experiencing any problems, there is no need to return our call.  We will assume that you have returned to your regular daily activities without incident.  If any biopsies were taken you will be contacted by phone or by letter within the next 1-3 weeks.  Please call us at [phone] if you have not heard about the biopsies in 3 weeks.    SIGNATURES/CONFIDENTIALITY: You and/or your care partner have signed paperwork which will be entered into your electronic medical record.  These signatures attest to the fact that that the information above on your After Visit Summary has been reviewed and is understood.  Full responsibility of the confidentiality of this discharge information lies with you and/or your care-partner.  Polyps-handout given  Repeat colonoscopy will be determined by pathology.  

## 2015-12-27 NOTE — Progress Notes (Signed)
To recovery, report to Mirts, RN, VSS. 

## 2015-12-27 NOTE — Op Note (Signed)
[place]  [address] [place], [address]   COLONOSCOPY PROCEDURE REPORT  PATIENT: Sergio Parker, Sergio Parker  MR#: [id number] BIRTHDATE: 1966/03/22 , 49  yrs. old GENDER: male ENDOSCOPIST: Wilfrid Lund, MD REFERRED [id number] Tis[place]c, MD PROCEDURE DATE:  12/27/2015 PROCEDURE:   Colonoscopy, screening and Colonoscopy with snare polypectomy First Screening Colonoscopy - Avg.  risk and is 50 yrs.  old or older Yes.  Prior Negative Screening - Now for repeat screening. N/A  History of Adenoma - Now for follow-up colonoscopy & has been > or = to 3 yrs.  N/A  Polyps removed today? Yes ASA CLASS:   Class II INDICATIONS:Screening for colonic neoplasia and Colorectal Neoplasm Risk Assessment for this procedure is average risk. MEDICATIONS: Monitored anesthesia care and Propofol 250 mg IV  DESCRIPTION OF PROCEDURE:   After the risks benefits and alternatives of the procedure were thoroughly explained, informed consent was obtained.  The digital rectal exam revealed no abnormalities of the rectum.   The LB [id number]  endoscope was introduced through the anus and advanced to the cecum, which was identified by both the appendix and ileocecal valve. No adverse events experienced.   The quality of the prep was excellent. (Suprep was used)  The instrument was then slowly withdrawn as the colon was fully examined. Estimated blood loss is zero unless otherwise noted in this procedure report.      COLON FINDINGS: A sessile polyp measuring 6 mm in size was found in the sigmoid colon.  A polypectomy was performed with a cold snare. The resection was complete, the polyp tissue was completely retrieved and sent to histology.   A sessile polyp measuring 2 mm in size was found in the rectum.  A polypectomy was performed with a cold snare.  The resection was complete, the polyp tissue was completely retrieved and sent to histology.  Retroflexed views revealed internal Grade I  hemorrhoids. The time to cecum = 3.2 Withdrawal time = 9.6   The scope was withdrawn and the procedure completed. COMPLICATIONS: There were no immediate complications.  ENDOSCOPIC IMPRESSION: 1.   Sessile polyp was found in the sigmoid colon; polypectomy was performed with a cold snare 2.   Sessile polyp was found in the rectum; polypectomy was performed with a cold snare  RECOMMENDATIONS: Colonoscopy interval dependent on polyp results.  eSigned:  Wilfrid Lund, MD 12/27/2015 1:41 PM   cc:

## 2015-12-27 NOTE — Progress Notes (Signed)
Called to room to assist during endoscopic procedure.  Patient ID and intended procedure confirmed with present staff. Received instructions for my participation in the procedure from the performing physician.  

## 2015-12-28 ENCOUNTER — Telehealth: Payer: Self-pay | Admitting: *Deleted

## 2015-12-28 NOTE — Telephone Encounter (Signed)
  Follow up Call-  Call back number 12/27/2015  Post procedure Call Back phone  # [phone]  Permission to leave phone message Yes     Patient questions:  Do you have a fever, pain , or abdominal swelling? No. Pain Score  0 *  Have you tolerated food without any problems? Yes.    Have you been able to return to your normal activities? Yes.    Do you have any questions about your discharge instructions: Diet   No. Medications  No. Follow up visit  No.  Do you have questions or concerns about your Care? No.  Actions: * If pain score is 4 or above: No action needed, pain <4.

## 2015-12-31 ENCOUNTER — Encounter: Payer: Self-pay | Admitting: Gastroenterology

## 2016-01-04 ENCOUNTER — Telehealth: Payer: Self-pay

## 2016-01-04 NOTE — Telephone Encounter (Signed)
Spoke to pt and advised him that his appt with Dr. Brett Fairy on 3/16 will have to be rescheduled because she will be out of the office. I offered pt several appt slots. Pt finally decided that he could come on 3/16 at 12:00. Pt was not happy about being rescheduled. He still insists that he needs to see Dr. Brett Fairy to discuss sleep study results before starting cpap, because he felt like he has not been given enough information about starting a cpap and why he needs a cpap.

## 2016-01-06 ENCOUNTER — Ambulatory Visit (INDEPENDENT_AMBULATORY_CARE_PROVIDER_SITE_OTHER): Payer: BLUE CROSS/BLUE SHIELD | Admitting: Neurology

## 2016-01-06 ENCOUNTER — Encounter: Payer: Self-pay | Admitting: Neurology

## 2016-01-06 ENCOUNTER — Ambulatory Visit: Payer: BLUE CROSS/BLUE SHIELD | Admitting: Neurology

## 2016-01-06 VITALS — BP 130/82 | HR 78 | Resp 20 | Ht 67.0 in | Wt 180.0 lb

## 2016-01-06 DIAGNOSIS — J305 Allergic rhinitis due to food: Secondary | ICD-10-CM

## 2016-01-06 DIAGNOSIS — G4733 Obstructive sleep apnea (adult) (pediatric): Secondary | ICD-10-CM

## 2016-01-06 DIAGNOSIS — J322 Chronic ethmoidal sinusitis: Secondary | ICD-10-CM

## 2016-01-06 MED ORDER — BUDESONIDE 32 MCG/ACT NA SUSP: NASAL | Status: DC

## 2016-01-06 NOTE — Patient Instructions (Signed)
We will meet in 30 days to review your CPAP use and compliance.

## 2016-01-06 NOTE — Progress Notes (Signed)
SLEEP MEDICINE CLINIC   Provider:  Larey Seat, M D  Referring Provider: Haywood Pao, MD Primary Care Physician:  Haywood Pao, MD  Chief Complaint  Patient presents with  . Follow-up    wants to discuss     HPI:  Sergio Parker is a 50 y.o. male , seen here as a referral  from Dr. Osborne Casco for a sleep evaluation,  Chief complaint according to patient : " my wife sends me "   Sergio Parker describes that he has been diagnosed with bruxism, his dentist has manufactured a device for him to take the strain off his teeth. He notices a difference in pain and soreness if he used the device or not but it seems not to affect his level of snoring. Sergio Parker states that she has to go to bed first to be asleep because she cannot fall asleep when her husband is already snoring. She sometimes spends at night and another bedroom. The dog and its owner are going to bed together, and the dog seems to be nor ricing his owners breathing to stop , to gasp and to have crescendo snoring. Sergio Parker also has shoulder pain on the right and this was the sleep position her snored the least in.  He faces to his wife when he snores. There are evenings when his wife is already in bed watching TV and she can hear her husband snoring on the lower floor. When he drank alcohol he snores louder. He has a crowded lower jaw.  Has the above named device for bruxism, but this  has no snoring treatment potential. He also is a habitual mouth breather, nose  congested.   He has a past medical History of avascular necrosis of the hip. Lost ROM. Now has shoulder pain.  Sleep habits are as follows: He falls asleep promptly-He usually goes to bed between 9 and 10 PM, he usually watches some TV but only for couple of minutes or so. The bedroom is otherwise cool, quiet and dark. The dog sleeps in the same bed. He wakes only when his shoulder pain is present, rarely for a bathroom break.  Rising at 5.30 he will get  6-7 hours of sleep. He wakes spontaneously but does not necessarily rely on an alarm. He does not take breakfast, he commutes 45 minutes, he has no daylight at his work space. He works for [employer]. Sleep medical history and family sleep history:  His father and his brother pulse are loud snorer since seemed to have potential for sleep apnea. Mother has frequent sinusitis. He has no personal history of sleepwalking or night terrors.   Social history; snuff user, occasional smoker, 10 drinks a week. Caffeine use 3 a week.     Interval history from 01/06/2016. Sergio Parker underwent a split-night polysomnography at [facility] sleep on 12/03/2015. He was diagnosed with a mild to moderate degree of apnea his AHI was 18.9. During REM sleep the AHI became 40.5 and supine sleep 21.1. The patient also had isolated periodic limb movements none of them during REM sleep. And he had a regular sinus rhythm and heart rate. He was titrated to a final pressure of 8 cm water with a reduction of the AHI to 0.0. He reports that a full face mask was the most comfortable for him and he has a long-standing history of nasal congestion and a habitual mouth breather. Since CPAP usually applies pressure through the nose I would like for him  to try Rhinocort and perhaps an antihistamine at night, this should make it possible for [name] to treat his REM dependent sleep apnea with CPAP. I suggested since we are not sure if the outcome is beneficial for the patient to use alone or a rental CPAP for 30 days so that he has a fair chance at seeing Nadara Mustard will work for him. I will prescribe the rhinitis-sinusitis medications here today and then sent an order for a rental CPAP machine with a full face mask possibly amana view, to the DME. He reported that wife CPAP was applied in the lab he woke up 2 or 3 times a feeling of gulping or gasping for air more of a choking sensation I think this was related to having to breathe through the  mouth.  Review of Systems: Out of a complete 14 system review, the patient complains of only the following symptoms, and all other reviewed systems are negative. How likely are you to doze in the following situations: 0 = not likely, 1 = slight chance, 2 = moderate chance, 3 = high chance  Sitting and Reading? 1 Watching Television?2 Sitting inactive in a public place (theater or meeting)?1 Lying down in the afternoon when circumstances permit?0 Sitting and talking to someone?1 Sitting quietly after lunch without alcohol?0 In a car, while stopped for a few minutes in traffic?0 As a passenger in a car for an hour without a break?0  Total = 5  Epworth score 5 , Fatigue severity score 25  , depression score n/a    Social History   Social History  . Marital Status: Married    Spouse Name: N/A  . Number of Children: N/A  . Years of Education: N/A   Occupational History  . Not on file.   Social History Main Topics  . Smoking status: Former Smoker    Types: Cigars  . Smokeless tobacco: Current User    Types: Snuff     Comment: 1-2 cigars day years ago  . Alcohol Use: 16.8 oz/week    28 Cans of beer per week     Comment: "4 beers per day" per pt/RM  . Drug Use: No  . Sexual Activity: No   Other Topics Concern  . Not on file   Social History Narrative   Drinks about 1 cup of caffeine daily.    Family History  Problem Relation Age of Onset  . Hypertension Mother   . Hypertension Father   . Colon cancer Neg Hx     Past Medical History  Diagnosis Date  . Hypertension     PCP Dr Osborne Casco  . Arthritis   . Hypertriglyceridemia   . Obesity   . Avascular necrosis ([place])   . Microalbuminuria   . ED (erectile dysfunction)     Past Surgical History  Procedure Laterality Date  . Mouth surgery    . Total hip arthroplasty  01/12/2012    Procedure: TOTAL HIP ARTHROPLASTY ANTERIOR APPROACH;  Surgeon: Mcarthur Rossetti, MD;  Location: [facility];  Service: Orthopedics;   Laterality: Left;  Left Total Hip Arthroplasty, Anterior Approach  . Total hip arthroplasty  09/20/2012    Procedure: TOTAL HIP ARTHROPLASTY ANTERIOR APPROACH;  Surgeon: Mcarthur Rossetti, MD;  Location: [facility];  Service: Orthopedics;  Laterality: Right;    Current Outpatient Prescriptions  Medication Sig Dispense Refill  . lisinopril (PRINIVIL,ZESTRIL) 40 MG tablet Take 40 mg by mouth daily before breakfast.     No current facility-administered medications  for this visit.    Allergies as of 01/06/2016  . (No Known Allergies)    Vitals: BP 130/82 mmHg  Pulse 78  Resp 20  Ht 5\' 7"  (1.702 m)  Wt 180 lb (81.647 kg)  BMI 28.19 kg/m2 Last Weight:  Wt Readings from Last 1 Encounters:  01/06/16 180 lb (81.647 kg)   [id number] mass index is 28.19 kg/(m^2).     Last Height:   Ht Readings from Last 1 Encounters:  01/06/16 5\' 7"  (1.702 m)    Physical exam:  General: The patient is awake, alert and appears not in acute distress. The patient is well groomed. Head: Normocephalic, atraumatic. Neck is supple. Mallampati 3,  neck circumference:17 Nasal airflow restricted ,congested  TMJ is not  evident . Retrognathia is not seen.  Cardiovascular:  Regular rate and rhythm , without  murmurs or carotid bruit, and without distended neck veins. Respiratory: Lungs are clear to auscultation. Skin:  Without evidence of edema, or rash Trunk: BMI is elevated . Neurologic exam : The patient is awake and alert, oriented to place and time.   Memory subjective  described as intact.  Attention span & concentration ability appears normal.  Speech is fluent,  without  dysarthria, dysphonia or aphasia.  Mood and affect are appropriate.  Cranial nerves: Pupils are equal and briskly reactive to light. Funduscopic exam without  evidence of pallor or edema.  Extraocular movements  in vertical and horizontal planes intact and without nystagmus. Visual fields by finger perimetry are intact. Hearing to  finger rub intact.   Facial sensation intact to fine touch.  Facial motor strength is symmetric and tongue and uvula move midline. Shoulder shrug was symmetrical.    The patient was advised of the nature of the diagnosed sleep disorder , the treatment options and risks for general a health and wellness arising from not treating the condition.  I spent more than 20 minutes of face to face time with the patient. Greater than 50% of time was spent in counseling and coordination of care. We have discussed the diagnosis and differential and I answered the patient's questions.     Assessment:  After physical and neurologic examination, review of laboratory studies,  Personal review of imaging studies, reports of other /same  Imaging studies ,  Results of polysomnography/ neurophysiology testing and pre-existing records as far as provided in visit., my assessment is   1) patient with joint pain related restricition of movement, difficulties to find a comfortable sleep position, which no excludes sleeping on his right side. If he finds his sleep interrupted it is because of discomfort and joint pain. He had PLMs onlu y in NREM sleep.  2) the patient snores loudly and has been witnessed to stop in his breathing and clinically as well as anatomically has a lot of risk factors for obstructive sleep apnea plus a positive family history. Moderate AHi , with RE accentuation, start CPAP  Trial on loaner for 30 days.at 8 cm water with humidifier. FFM requested.   3) Rhinitis, sinusitis. Congestion, - start Rhinocort and use saline nasal rinse. Zyrtec as needed.    4) Rv in 60 days with review of the trial data. [facility] or Harriette Bouillon Kade Rickels MD  01/06/2016   CC: Haywood Pao, [place], [place] [id number]

## 2016-01-06 NOTE — Addendum Note (Signed)
Addended by: Lester Florence A on: 01/06/2016 01:28 PM   Modules accepted: Orders

## 2016-01-11 ENCOUNTER — Encounter: Payer: Self-pay | Admitting: *Deleted

## 2016-02-14 ENCOUNTER — Telehealth: Payer: Self-pay

## 2016-02-14 NOTE — Telephone Encounter (Signed)
Received this notice from [facility] [place]: Mr Sergio Parker has advised me he does not want to pursue Cpap at this time but that he has my contact information incase he changes his mind.

## 2016-02-17 ENCOUNTER — Ambulatory Visit: Payer: BLUE CROSS/BLUE SHIELD | Admitting: Neurology

## 2016-03-16 DIAGNOSIS — M25511 Pain in right shoulder: Secondary | ICD-10-CM | POA: Diagnosis not present

## 2016-03-20 DIAGNOSIS — M25511 Pain in right shoulder: Secondary | ICD-10-CM | POA: Diagnosis not present

## 2016-03-20 DIAGNOSIS — S46811S Strain of other muscles, fascia and tendons at shoulder and upper arm level, right arm, sequela: Secondary | ICD-10-CM | POA: Diagnosis not present

## 2016-06-29 DIAGNOSIS — Z0000 Encounter for general adult medical examination without abnormal findings: Secondary | ICD-10-CM | POA: Diagnosis not present

## 2016-06-29 DIAGNOSIS — I10 Essential (primary) hypertension: Secondary | ICD-10-CM | POA: Diagnosis not present

## 2016-06-29 DIAGNOSIS — Z125 Encounter for screening for malignant neoplasm of prostate: Secondary | ICD-10-CM | POA: Diagnosis not present

## 2016-07-04 DIAGNOSIS — M87059 Idiopathic aseptic necrosis of unspecified femur: Secondary | ICD-10-CM | POA: Diagnosis not present

## 2016-07-04 DIAGNOSIS — Z1389 Encounter for screening for other disorder: Secondary | ICD-10-CM | POA: Diagnosis not present

## 2016-07-04 DIAGNOSIS — E668 Other obesity: Secondary | ICD-10-CM | POA: Diagnosis not present

## 2016-07-04 DIAGNOSIS — Z23 Encounter for immunization: Secondary | ICD-10-CM | POA: Diagnosis not present

## 2016-07-04 DIAGNOSIS — Z6827 Body mass index (BMI) 27.0-27.9, adult: Secondary | ICD-10-CM | POA: Diagnosis not present

## 2016-07-04 DIAGNOSIS — E781 Pure hyperglyceridemia: Secondary | ICD-10-CM | POA: Diagnosis not present

## 2016-07-04 DIAGNOSIS — Z0000 Encounter for general adult medical examination without abnormal findings: Secondary | ICD-10-CM | POA: Diagnosis not present

## 2016-07-04 DIAGNOSIS — I10 Essential (primary) hypertension: Secondary | ICD-10-CM | POA: Diagnosis not present

## 2016-10-03 DIAGNOSIS — H5201 Hypermetropia, right eye: Secondary | ICD-10-CM | POA: Diagnosis not present

## 2017-02-02 DIAGNOSIS — I10 Essential (primary) hypertension: Secondary | ICD-10-CM | POA: Diagnosis not present

## 2017-02-02 DIAGNOSIS — Z87891 Personal history of nicotine dependence: Secondary | ICD-10-CM | POA: Diagnosis not present

## 2017-02-02 DIAGNOSIS — F17200 Nicotine dependence, unspecified, uncomplicated: Secondary | ICD-10-CM | POA: Diagnosis not present

## 2017-02-02 DIAGNOSIS — J209 Acute bronchitis, unspecified: Secondary | ICD-10-CM | POA: Diagnosis not present

## 2017-02-02 DIAGNOSIS — R072 Precordial pain: Secondary | ICD-10-CM | POA: Diagnosis not present

## 2017-02-15 DIAGNOSIS — R072 Precordial pain: Secondary | ICD-10-CM | POA: Diagnosis not present

## 2017-07-02 DIAGNOSIS — Z125 Encounter for screening for malignant neoplasm of prostate: Secondary | ICD-10-CM | POA: Diagnosis not present

## 2017-07-02 DIAGNOSIS — R809 Proteinuria, unspecified: Secondary | ICD-10-CM | POA: Diagnosis not present

## 2017-07-02 DIAGNOSIS — Z0000 Encounter for general adult medical examination without abnormal findings: Secondary | ICD-10-CM | POA: Diagnosis not present

## 2017-07-05 DIAGNOSIS — Z0000 Encounter for general adult medical examination without abnormal findings: Secondary | ICD-10-CM | POA: Diagnosis not present

## 2017-07-05 DIAGNOSIS — G4733 Obstructive sleep apnea (adult) (pediatric): Secondary | ICD-10-CM | POA: Diagnosis not present

## 2017-07-05 DIAGNOSIS — Z6828 Body mass index (BMI) 28.0-28.9, adult: Secondary | ICD-10-CM | POA: Diagnosis not present

## 2017-07-05 DIAGNOSIS — M87059 Idiopathic aseptic necrosis of unspecified femur: Secondary | ICD-10-CM | POA: Diagnosis not present

## 2017-07-05 DIAGNOSIS — F5221 Male erectile disorder: Secondary | ICD-10-CM | POA: Diagnosis not present

## 2017-07-05 DIAGNOSIS — R808 Other proteinuria: Secondary | ICD-10-CM | POA: Diagnosis not present

## 2017-07-05 DIAGNOSIS — Z1389 Encounter for screening for other disorder: Secondary | ICD-10-CM | POA: Diagnosis not present

## 2017-07-05 DIAGNOSIS — Z23 Encounter for immunization: Secondary | ICD-10-CM | POA: Diagnosis not present

## 2017-07-06 DIAGNOSIS — Z1212 Encounter for screening for malignant neoplasm of rectum: Secondary | ICD-10-CM | POA: Diagnosis not present

## 2017-12-24 ENCOUNTER — Telehealth (INDEPENDENT_AMBULATORY_CARE_PROVIDER_SITE_OTHER): Payer: Self-pay | Admitting: Orthopaedic Surgery

## 2017-12-24 NOTE — Telephone Encounter (Signed)
Patient flying out tomorrow and had hip replacement surgery on both sides and needs a note to state that for security purposes.  Please all to advise as soon as possible [phone]

## 2017-12-25 NOTE — Telephone Encounter (Signed)
Patient aware I put card at front desk for him to pick up

## 2018-01-23 DIAGNOSIS — R110 Nausea: Secondary | ICD-10-CM | POA: Diagnosis not present

## 2018-01-23 DIAGNOSIS — R079 Chest pain, unspecified: Secondary | ICD-10-CM | POA: Diagnosis not present

## 2018-01-23 DIAGNOSIS — I10 Essential (primary) hypertension: Secondary | ICD-10-CM | POA: Diagnosis not present

## 2018-01-23 DIAGNOSIS — R531 Weakness: Secondary | ICD-10-CM | POA: Diagnosis not present

## 2018-01-23 DIAGNOSIS — Z96649 Presence of unspecified artificial hip joint: Secondary | ICD-10-CM | POA: Diagnosis not present

## 2018-01-23 DIAGNOSIS — R51 Headache: Secondary | ICD-10-CM | POA: Diagnosis not present

## 2018-01-23 DIAGNOSIS — R42 Dizziness and giddiness: Secondary | ICD-10-CM | POA: Diagnosis not present

## 2018-01-23 DIAGNOSIS — Z79899 Other long term (current) drug therapy: Secondary | ICD-10-CM | POA: Diagnosis not present

## 2018-01-23 DIAGNOSIS — F17210 Nicotine dependence, cigarettes, uncomplicated: Secondary | ICD-10-CM | POA: Diagnosis not present

## 2018-02-15 DIAGNOSIS — H90A22 Sensorineural hearing loss, unilateral, left ear, with restricted hearing on the contralateral side: Secondary | ICD-10-CM | POA: Diagnosis not present

## 2018-02-15 DIAGNOSIS — R51 Headache: Secondary | ICD-10-CM | POA: Diagnosis not present

## 2018-02-15 DIAGNOSIS — R42 Dizziness and giddiness: Secondary | ICD-10-CM | POA: Diagnosis not present

## 2018-02-27 DIAGNOSIS — R42 Dizziness and giddiness: Secondary | ICD-10-CM | POA: Diagnosis not present

## 2018-03-06 DIAGNOSIS — H8109 Meniere's disease, unspecified ear: Secondary | ICD-10-CM | POA: Diagnosis not present

## 2018-03-06 DIAGNOSIS — H90A22 Sensorineural hearing loss, unilateral, left ear, with restricted hearing on the contralateral side: Secondary | ICD-10-CM | POA: Diagnosis not present

## 2018-03-06 DIAGNOSIS — I10 Essential (primary) hypertension: Secondary | ICD-10-CM | POA: Diagnosis not present

## 2018-03-06 DIAGNOSIS — H905 Unspecified sensorineural hearing loss: Secondary | ICD-10-CM | POA: Diagnosis not present

## 2018-03-06 DIAGNOSIS — R51 Headache: Secondary | ICD-10-CM | POA: Diagnosis not present

## 2018-07-05 DIAGNOSIS — R82998 Other abnormal findings in urine: Secondary | ICD-10-CM | POA: Diagnosis not present

## 2018-07-05 DIAGNOSIS — Z0000 Encounter for general adult medical examination without abnormal findings: Secondary | ICD-10-CM | POA: Diagnosis not present

## 2018-07-05 DIAGNOSIS — Z125 Encounter for screening for malignant neoplasm of prostate: Secondary | ICD-10-CM | POA: Diagnosis not present

## 2018-07-05 DIAGNOSIS — R808 Other proteinuria: Secondary | ICD-10-CM | POA: Diagnosis not present

## 2018-07-09 DIAGNOSIS — I10 Essential (primary) hypertension: Secondary | ICD-10-CM | POA: Diagnosis not present

## 2018-07-09 DIAGNOSIS — E781 Pure hyperglyceridemia: Secondary | ICD-10-CM | POA: Diagnosis not present

## 2018-07-09 DIAGNOSIS — E663 Overweight: Secondary | ICD-10-CM | POA: Diagnosis not present

## 2018-07-09 DIAGNOSIS — M87059 Idiopathic aseptic necrosis of unspecified femur: Secondary | ICD-10-CM | POA: Diagnosis not present

## 2018-07-09 DIAGNOSIS — Z0000 Encounter for general adult medical examination without abnormal findings: Secondary | ICD-10-CM | POA: Diagnosis not present

## 2018-07-09 DIAGNOSIS — Z1389 Encounter for screening for other disorder: Secondary | ICD-10-CM | POA: Diagnosis not present

## 2019-07-08 DIAGNOSIS — R82998 Other abnormal findings in urine: Secondary | ICD-10-CM | POA: Diagnosis not present

## 2019-07-08 DIAGNOSIS — I10 Essential (primary) hypertension: Secondary | ICD-10-CM | POA: Diagnosis not present

## 2019-07-08 DIAGNOSIS — Z23 Encounter for immunization: Secondary | ICD-10-CM | POA: Diagnosis not present

## 2019-07-08 DIAGNOSIS — E781 Pure hyperglyceridemia: Secondary | ICD-10-CM | POA: Diagnosis not present

## 2019-07-08 DIAGNOSIS — Z125 Encounter for screening for malignant neoplasm of prostate: Secondary | ICD-10-CM | POA: Diagnosis not present

## 2019-07-08 DIAGNOSIS — Z0000 Encounter for general adult medical examination without abnormal findings: Secondary | ICD-10-CM | POA: Diagnosis not present

## 2019-07-15 DIAGNOSIS — Z1331 Encounter for screening for depression: Secondary | ICD-10-CM | POA: Diagnosis not present

## 2019-07-15 DIAGNOSIS — I10 Essential (primary) hypertension: Secondary | ICD-10-CM | POA: Diagnosis not present

## 2019-07-15 DIAGNOSIS — G4733 Obstructive sleep apnea (adult) (pediatric): Secondary | ICD-10-CM | POA: Diagnosis not present

## 2019-07-15 DIAGNOSIS — E663 Overweight: Secondary | ICD-10-CM | POA: Diagnosis not present

## 2019-07-15 DIAGNOSIS — Z0000 Encounter for general adult medical examination without abnormal findings: Secondary | ICD-10-CM | POA: Diagnosis not present

## 2019-07-15 DIAGNOSIS — E781 Pure hyperglyceridemia: Secondary | ICD-10-CM | POA: Diagnosis not present

## 2020-01-19 ENCOUNTER — Other Ambulatory Visit: Payer: Self-pay

## 2020-01-19 ENCOUNTER — Emergency Department (HOSPITAL_COMMUNITY): Payer: BC Managed Care – PPO

## 2020-01-19 ENCOUNTER — Emergency Department (HOSPITAL_COMMUNITY)
Admission: EM | Admit: 2020-01-19 | Discharge: 2020-01-19 | Disposition: A | Discharge status: Home / Self Care | Payer: BC Managed Care – PPO | Attending: Emergency Medicine | Admitting: Emergency Medicine

## 2020-01-19 DIAGNOSIS — F17228 Nicotine dependence, chewing tobacco, with other nicotine-induced disorders: Secondary | ICD-10-CM | POA: Insufficient documentation

## 2020-01-19 DIAGNOSIS — Z96642 Presence of left artificial hip joint: Secondary | ICD-10-CM | POA: Diagnosis not present

## 2020-01-19 DIAGNOSIS — M25552 Pain in left hip: Secondary | ICD-10-CM | POA: Diagnosis not present

## 2020-01-19 DIAGNOSIS — I10 Essential (primary) hypertension: Secondary | ICD-10-CM | POA: Diagnosis not present

## 2020-01-19 DIAGNOSIS — Z79899 Other long term (current) drug therapy: Secondary | ICD-10-CM | POA: Insufficient documentation

## 2020-01-19 DIAGNOSIS — Z96641 Presence of right artificial hip joint: Secondary | ICD-10-CM | POA: Insufficient documentation

## 2020-01-19 DIAGNOSIS — M79652 Pain in left thigh: Secondary | ICD-10-CM | POA: Diagnosis not present

## 2020-01-19 MED ORDER — OXYCODONE-ACETAMINOPHEN 5-325 MG PO TABS: 1.0000 | ORAL_TABLET | ORAL | 0 refills | Status: DC | PRN

## 2020-01-19 MED ORDER — METHOCARBAMOL 750 MG PO TABS: 750.0000 mg | ORAL_TABLET | Freq: Four times a day (QID) | ORAL | 0 refills | Status: DC

## 2020-01-19 MED ORDER — MORPHINE SULFATE (PF) 4 MG/ML IV SOLN
8.0000 mg | Freq: Once | INTRAVENOUS | Status: AC
  Administered 2020-01-19: 09:00:00 8 mg via INTRAVENOUS
  Filled 2020-01-19: qty 2

## 2020-01-19 NOTE — ED Provider Notes (Addendum)
[facility] DEPT Provider Note   CSN: [id number] Arrival date & time: 01/19/20  0700     History Chief Complaint  Patient presents with  . Hip Pain    Sergio Parker is a 54 y.o. male.  54 year old male with history of avascular necrosis and bilateral hip replacements presents with acute onset of left hip pain which started yesterday after he got up out of a chair.  Patient states that he did have increased activity this weekend.  Denies any recent fever or chills.  No trauma.  States that pain is worse when he tries to bear weight and characterizes pressure.  States it feels like when he had to have his hips replaced in the past.  Medicated with Motrin and Valium with no relief.  Pain better with remaining still.        Past Medical History:  Diagnosis Date  . Arthritis   . Avascular necrosis ([place])   . ED (erectile dysfunction)   . Hypertension    PCP Dr Osborne Casco  . Hypertriglyceridemia   . Microalbuminuria   . Obesity     Patient Active Problem List   Diagnosis Date Noted  . Avascular necrosis of bones of both hips ([place]) 09/20/2012  . Avascular necrosis of hip ([place]) 01/12/2012    Past Surgical History:  Procedure Laterality Date  . MOUTH SURGERY    . TOTAL HIP ARTHROPLASTY  01/12/2012   Procedure: TOTAL HIP ARTHROPLASTY ANTERIOR APPROACH;  Surgeon: Mcarthur Rossetti, MD;  Location: [facility];  Service: Orthopedics;  Laterality: Left;  Left Total Hip Arthroplasty, Anterior Approach  . TOTAL HIP ARTHROPLASTY  09/20/2012   Procedure: TOTAL HIP ARTHROPLASTY ANTERIOR APPROACH;  Surgeon: Mcarthur Rossetti, MD;  Location: [facility];  Service: Orthopedics;  Laterality: Right;       Family History  Problem Relation Age of Onset  . Hypertension Mother   . Hypertension Father   . Colon cancer Neg Hx     Social History   Tobacco Use  . Smoking status: Former Smoker    Types: Cigars  . Smokeless tobacco: Current User    Types:  Snuff  . Tobacco comment: 1-2 cigars day years ago  Substance Use Topics  . Alcohol use: Yes    Alcohol/week: 28.0 standard drinks    Types: 28 Cans of beer per week    Comment: "4 beers per day" per pt/RM  . Drug use: No    Home Medications Prior to Admission medications   Medication Sig Start Date End Date Taking? Authorizing Provider  budesonide (RHINOCORT AQUA) 32 MCG/ACT nasal spray Each nostril one spray, before CPAP applied. 01/06/16   Dohmeier, Asencion Partridge, MD  lisinopril (PRINIVIL,ZESTRIL) 40 MG tablet Take 40 mg by mouth daily before breakfast.    [provider]    Allergies    Patient has no known allergies.  Review of Systems   Review of Systems  All other systems reviewed and are negative.   Physical Exam Updated Vital Signs BP 126/79   Pulse 92   Temp 98.9 F (37.2 C) (Oral)   Resp 18   Ht 1.702 m (5\' 7" )   Wt 81.6 kg   SpO2 97%   BMI 28.19 kg/m   Physical Exam Vitals and nursing note reviewed.  Constitutional:      General: He is not in acute distress.    Appearance: Normal appearance. He is well-developed. He is not toxic-appearing.  HENT:     Head:  Normocephalic and atraumatic.  Eyes:     General: Lids are normal.     Conjunctiva/sclera: Conjunctivae normal.     Pupils: Pupils are equal, round, and reactive to light.  Neck:     Thyroid: No thyroid mass.     Trachea: No tracheal deviation.  Cardiovascular:     Rate and Rhythm: Normal rate and regular rhythm.     Heart sounds: Normal heart sounds. No murmur. No gallop.   Pulmonary:     Effort: Pulmonary effort is normal. No respiratory distress.     Breath sounds: Normal breath sounds. No stridor. No decreased breath sounds, wheezing, rhonchi or rales.  Abdominal:     General: Bowel sounds are normal. There is no distension.     Palpations: Abdomen is soft.     Tenderness: There is no abdominal tenderness. There is no rebound.  Musculoskeletal:        General: No tenderness. Normal  range of motion.     Cervical back: Normal range of motion and neck supple.       Legs:     Comments: Neurovascular intact at left foot.  No shortening or rotation to the lower extremity.  Patient able to flex left knee.  Some discomfort with internal and external rotation of the extremity.  Skin:    General: Skin is warm and dry.     Findings: No abrasion or rash.  Neurological:     Mental Status: He is alert and oriented to person, place, and time.     GCS: GCS eye subscore is 4. GCS verbal subscore is 5. GCS motor subscore is 6.     Cranial Nerves: No cranial nerve deficit.     Sensory: No sensory deficit.  Psychiatric:        Speech: Speech normal.        Behavior: Behavior normal.     ED Results / Procedures / Treatments   Labs (all labs ordered are listed, but only abnormal results are displayed) Labs Reviewed - No data to display  EKG None  Radiology No results found.  Procedures Procedures (including critical care time)  Medications Ordered in ED Medications  morphine 4 MG/ML injection 8 mg (has no administration in time range)    ED Course  I have reviewed the triage vital signs and the nursing notes.  Pertinent labs & imaging results that were available during my care of the patient were reviewed by me and considered in my medical decision making (see chart for details).    MDM Rules/Calculators/A&P                      Patient medicated for pain here and does feel better.  X-ray of his hip was negative.  Will give orthopedic referral and placed on short course of opiates as well as Muscle relaxants.  Will give crutches to be used as needed Final Clinical Impression(s) / ED Diagnoses Final diagnoses:  None    Rx / DC Orders ED Discharge Orders    None       Lacretia Leigh, MD 01/19/20 0945    Lacretia Leigh, MD 01/19/20 [phone]

## 2020-01-27 ENCOUNTER — Other Ambulatory Visit: Payer: Self-pay

## 2020-01-27 ENCOUNTER — Ambulatory Visit (INDEPENDENT_AMBULATORY_CARE_PROVIDER_SITE_OTHER): Payer: BC Managed Care – PPO | Admitting: Orthopaedic Surgery

## 2020-01-27 ENCOUNTER — Encounter: Payer: Self-pay | Admitting: Orthopaedic Surgery

## 2020-01-27 DIAGNOSIS — Z5329 Procedure and treatment not carried out because of patient's decision for other reasons: Secondary | ICD-10-CM

## 2020-01-28 ENCOUNTER — Ambulatory Visit: Payer: BC Managed Care – PPO | Admitting: Orthopaedic Surgery

## 2020-01-28 ENCOUNTER — Encounter: Payer: Self-pay | Admitting: Orthopaedic Surgery

## 2020-01-28 DIAGNOSIS — M25559 Pain in unspecified hip: Secondary | ICD-10-CM

## 2020-01-28 MED ORDER — METHOCARBAMOL 750 MG PO TABS: 750.0000 mg | ORAL_TABLET | Freq: Four times a day (QID) | ORAL | 1 refills | Status: DC

## 2020-01-28 NOTE — Progress Notes (Signed)
rescheduled

## 2020-01-28 NOTE — Progress Notes (Signed)
Office Visit Note   Patient: Sergio Parker           Date of Birth: 1966-01-14           MRN: [id number] Visit Date: 01/28/2020              Requested by: Haywood Pao, MD [address] [place],  [place] [address] PCP: Haywood Pao, MD   Assessment & Plan: Visit Diagnoses:  1. Hip pain     Plan: He will slowly return to normal activities as tolerated.  Avoid internal rotation while painful.  He can follow-up with [name] as needed or if his condition does not continue to improve.  He may require a MRI either of his hip or back.  He will call us if he develops any radicular symptoms especially numbness tingling down the left leg.  Questions were encouraged and answered by Dr. Ninfa Linden and myself.  Follow-Up Instructions: Return if symptoms worsen or fail to improve.   Orders:  No orders of the defined types were placed in this encounter.  Meds ordered this encounter  Medications  . methocarbamol (ROBAXIN-750) 750 MG tablet    Sig: Take 1 tablet (750 mg total) by mouth 4 (four) times daily.    Dispense:  40 tablet    Refill:  1      Procedures: No procedures performed   Clinical Data: No additional findings.   Subjective: Chief Complaint  Patient presents with  . Left Hip - Pain    HPI Mr. Bland Span is well-known to our department service comes in today due to left hip pain that started 10 days ago.  He states he was sitting with his dog recliner let the recliner down and went to get up planted his left foot and then turned he had severe pain in the left hip after that.  He is concerned about the hip "popping out due to significant pain and inability to bear weight.  He was seen in the [facility], ER on 01/19/2020 due to continued left hip pain where radiographs were obtained.  I personally reviewed the radiographs which include AP pelvis AP left hip and lateral view of the left hip view shows no acute findings.  Hip components are all well seated.  No bony  abnormalities.  Left hip well located. He states now that his pain is improving.  He is using a crutch to ambulate.  Still has sharp pain in the groin area and across the left thigh in a diagonal lateral to medial distribution.  He denies any numbness tingling down the left leg.  Does have some pain in radiates down to the left lower leg.  He denies any bruising.  Denies any other injury.  Review of Systems  Constitutional: Negative for chills and fever.  Respiratory: Negative for shortness of breath.   Musculoskeletal: Positive for gait problem. Negative for back pain.  Neurological: Negative for numbness.     Objective: Vital Signs: There were no vitals taken for this visit.  Physical Exam Constitutional:      Appearance: He is normal weight. He is not ill-appearing or diaphoretic.  Pulmonary:     Effort: Pulmonary effort is normal.  Neurological:     Mental Status: He is alert and oriented to person, place, and time.  Psychiatric:        Mood and Affect: Mood normal.     Ortho Exam Left hip good range of motion discomfort with internal rotation.  Flexion of the hip causes no significant pain.  He does have some discomfort with standing but is able to bear some weight on the hip.  No tenderness over the left trochanteric region.  Negative straight leg raise on the left.  Left knee good range of motion without pain nontender with palpation.  Left calf is supple nontender.  Dorsiflexion plantarflexion left ankle intact. Specialty Comments:  No specialty comments available.  Imaging: No results found.   PMFS History: Patient Active Problem List   Diagnosis Date Noted  . Avascular necrosis of bones of both hips ([place]) 09/20/2012  . Avascular necrosis of hip ([place]) 01/12/2012   Past Medical History:  Diagnosis Date  . Arthritis   . Avascular necrosis ([place])   . ED (erectile dysfunction)   . Hypertension    PCP Dr Osborne Casco  . Hypertriglyceridemia   . Microalbuminuria   .  Obesity     Family History  Problem Relation Age of Onset  . Hypertension Mother   . Hypertension Father   . Colon cancer Neg Hx     Past Surgical History:  Procedure Laterality Date  . MOUTH SURGERY    . TOTAL HIP ARTHROPLASTY  01/12/2012   Procedure: TOTAL HIP ARTHROPLASTY ANTERIOR APPROACH;  Surgeon: Mcarthur Rossetti, MD;  Location: [facility];  Service: Orthopedics;  Laterality: Left;  Left Total Hip Arthroplasty, Anterior Approach  . TOTAL HIP ARTHROPLASTY  09/20/2012   Procedure: TOTAL HIP ARTHROPLASTY ANTERIOR APPROACH;  Surgeon: Mcarthur Rossetti, MD;  Location: [facility];  Service: Orthopedics;  Laterality: Right;   Social History   Occupational History  . Not on file  Tobacco Use  . Smoking status: Former Smoker    Types: Cigars  . Smokeless tobacco: Current User    Types: Snuff  . Tobacco comment: 1-2 cigars day years ago  Substance and Sexual Activity  . Alcohol use: Yes    Alcohol/week: 28.0 standard drinks    Types: 28 Cans of beer per week    Comment: "4 beers per day" per pt/RM  . Drug use: No  . Sexual activity: Never

## 2020-05-24 ENCOUNTER — Telehealth: Payer: Self-pay | Admitting: Orthopaedic Surgery

## 2020-05-24 ENCOUNTER — Other Ambulatory Visit: Payer: Self-pay | Admitting: Orthopaedic Surgery

## 2020-05-24 ENCOUNTER — Other Ambulatory Visit: Payer: Self-pay | Admitting: Radiology

## 2020-05-24 DIAGNOSIS — M4807 Spinal stenosis, lumbosacral region: Secondary | ICD-10-CM

## 2020-05-24 DIAGNOSIS — M25559 Pain in unspecified hip: Secondary | ICD-10-CM

## 2020-05-24 NOTE — Telephone Encounter (Signed)
Patient is continuing to have left hip pain, now with radiating left thigh pain with atrophy. No radiating numbness. Patient last seen April 2021, noted may need hip or lumbar MRI.  Would you like patient to come in for OV or which MRI to order?  Thank you

## 2020-05-24 NOTE — Telephone Encounter (Signed)
I do feel that we need both a MRI of the lumbar spine to rule out nerve compression and a MRI of the left hip to assess for any implant loosening.

## 2020-05-24 NOTE — Telephone Encounter (Signed)
Can you please call 

## 2020-05-24 NOTE — Telephone Encounter (Signed)
Sergio Parker from [employer] called requesting a call back from Dr. Ninfa Linden or his nurse. Sergio Parker states he need to speak to either doctor or nurse about patient's left hip. Please call Greg at [phone].

## 2020-05-24 NOTE — Telephone Encounter (Signed)
Left voicemail for callback.

## 2020-05-24 NOTE — Telephone Encounter (Signed)
Orders placed. Left voicemail, informing patient.

## 2020-05-25 ENCOUNTER — Telehealth: Payer: Self-pay | Admitting: Orthopaedic Surgery

## 2020-05-25 NOTE — Telephone Encounter (Signed)
Pt called stating [facility] has an opening 2 weeks ahead of [name] and the pt would like for his referral to be put in at [facility] and would like a CB from [place] when that's been done.  [phone]

## 2020-05-25 NOTE — Telephone Encounter (Signed)
See below please. 

## 2020-06-01 ENCOUNTER — Telehealth: Payer: Self-pay | Admitting: Orthopaedic Surgery

## 2020-06-01 NOTE — Telephone Encounter (Signed)
Pt would like a CB from [facility] in regards to still not receiving a call to set his MRI appt   [phone]

## 2020-06-01 NOTE — Telephone Encounter (Signed)
Patient wants to go to [facility] in [place] per his insurance stating it would be $1200 cheaper [facility] [place] [phone]

## 2020-06-02 ENCOUNTER — Telehealth: Payer: Self-pay | Admitting: Orthopedic Surgery

## 2020-06-02 ENCOUNTER — Telehealth: Payer: Self-pay | Admitting: Radiology

## 2020-06-02 NOTE — Telephone Encounter (Signed)
Faxed MRI orders of lumbar spine and left hip to [facility]: Lelon Frohlich: [phone]  Authorizations for each exam was added to be seen on orders.

## 2020-06-02 NOTE — Telephone Encounter (Signed)
-------  Fax Transmission Report-------  To:               Recipient at [phone] Subject:          SECURE: Frees,Finnean Result:           The transmission was successful. Explanation:      All Pages Ok Pages Sent:       7 Connect Time:     2 minutes, 58 seconds Transmit Time:    06/02/2020 15:32 Transfer Rate:    14400 Status Code:      0000 Retry Count:      0 Job Id:           [id number] Unique Id:        [id number] Fax Line:         1 Fax Server:       [employer]

## 2020-06-02 NOTE — Telephone Encounter (Signed)
Per patient wife [facility] said it would be very helpful if they could get the MRI images from his previous studies on his hips.  Can you forward the past studies to them as well?  Many thanks, Jannifer Hick have faxed [phone] ATTN: DORSEY) the only previous MRI reports I can find in system.  11/14/11 MRI Left hip, report only, imaging was done at external facility, we do not have any access to images. 08/01/12  MRI Right hip, report and imaging, unfortunately system unable to fax or forward the imaging, I did send report.                     -------Fax Transmission Report-------  To:               Recipient at [phone] Subject:          SECURE: Sergio Parker Result:           The transmission was successful. Explanation:      All Pages Ok Pages Sent:       4 Connect Time:     2 minutes, 6 seconds Transmit Time:    06/02/2020 16:27 Transfer Rate:    14400 Status Code:      0000 Retry Count:      0 Job Id:           [id number] Unique Id:        [id number] Fax Line:         10 Fax Server:       [employer]

## 2020-06-02 NOTE — Telephone Encounter (Signed)
I called Sergio Parker to advise that [place] sent Sergio Parker MRI orders to [facility] as requested.  Those orders have been faxed.  I also called [facility] and spoke with Sergio Parker. Sergio Parker advised me that she could not pull up Sergio Parker order because they had over 300 requests in their work que. She did offer to take the information from me verbally (and have [name] re-fax info to a private line later) and call Sergio Parker to set up his MRIs.  Advised Sergio Parker to please let me know if he has not heard from them regarding the appointment.

## 2020-06-02 NOTE — Telephone Encounter (Signed)
Orders have been faxed to the requested numbers given, they will contact pt to schedule appt

## 2020-06-05 DIAGNOSIS — M25562 Pain in left knee: Secondary | ICD-10-CM | POA: Diagnosis not present

## 2020-06-05 DIAGNOSIS — T1490XA Injury, unspecified, initial encounter: Secondary | ICD-10-CM | POA: Diagnosis not present

## 2020-06-05 DIAGNOSIS — S32512A Fracture of superior rim of left pubis, initial encounter for closed fracture: Secondary | ICD-10-CM | POA: Diagnosis not present

## 2020-06-05 DIAGNOSIS — S76812A Strain of other specified muscles, fascia and tendons at thigh level, left thigh, initial encounter: Secondary | ICD-10-CM | POA: Diagnosis not present

## 2020-06-05 DIAGNOSIS — Z96642 Presence of left artificial hip joint: Secondary | ICD-10-CM | POA: Diagnosis not present

## 2020-06-05 DIAGNOSIS — M545 Low back pain: Secondary | ICD-10-CM | POA: Diagnosis not present

## 2020-06-11 ENCOUNTER — Telehealth: Payer: Self-pay | Admitting: Orthopaedic Surgery

## 2020-06-11 NOTE — Telephone Encounter (Signed)
Patient called requesting a call back. Patient states the last visit with Dr. Sharol Given he stated he would like lab done on patient. Patient wants to know if he still need blood work done and if so can he have lab work done the same scheduled appt for his MRI results or do he need labs done before MRI appt. Please call patient at [phone].

## 2020-06-11 NOTE — Telephone Encounter (Signed)
Holding for CB on Monday this is not a [place] pt

## 2020-06-14 NOTE — Telephone Encounter (Signed)
Patient aware of the below message  

## 2020-06-14 NOTE — Telephone Encounter (Signed)
When I see the patient this week, we will obtain new x-rays on the patient's left hip and go over the MRI results.  We will also obtain labs that day in the office which will be a CBC, CRP and a sed rate.

## 2020-06-16 ENCOUNTER — Other Ambulatory Visit: Payer: Self-pay

## 2020-06-16 ENCOUNTER — Ambulatory Visit: Payer: BC Managed Care – PPO | Admitting: Orthopaedic Surgery

## 2020-06-16 ENCOUNTER — Encounter: Payer: Self-pay | Admitting: Orthopaedic Surgery

## 2020-06-16 ENCOUNTER — Ambulatory Visit: Payer: Self-pay

## 2020-06-16 DIAGNOSIS — M25559 Pain in unspecified hip: Secondary | ICD-10-CM | POA: Diagnosis not present

## 2020-06-16 DIAGNOSIS — M25552 Pain in left hip: Secondary | ICD-10-CM

## 2020-06-16 MED ORDER — DICLOFENAC SODIUM 75 MG PO TBEC: 75.0000 mg | DELAYED_RELEASE_TABLET | Freq: Two times a day (BID) | ORAL | 1 refills | Status: DC | PRN

## 2020-06-16 MED ORDER — HYDROCODONE-ACETAMINOPHEN 5-325 MG PO TABS: 1.0000 | ORAL_TABLET | Freq: Four times a day (QID) | ORAL | 0 refills | Status: DC | PRN

## 2020-06-16 NOTE — Progress Notes (Signed)
Office Visit Note   Patient: Sergio Parker           Date of Birth: Aug 12, 1966           MRN: [id number] Visit Date: 06/16/2020              Requested by: Haywood Pao, MD [address] [place],  [place] [address] PCP: Haywood Pao, MD   Assessment & Plan: Visit Diagnoses:  1. Hip pain     Plan: The differential diagnosis for his left hip is either significant trauma versus infectious process.  We will obtain a CBC with differential, sed rate and a CRP in the office today.  I would like to send him for a left hip joint aspiration under fluoroscopy to send the cell for Gram stain and culture.  This may be more trauma related based on what I am seeing on clinical exam and the MRI but we need to be as diligent as possible working up an infectious process.  He will still take it easy on that hip and I will try some hydrocodone and diclofenac for pain and inflammation.  We will schedule to see him back in 2 weeks but I will let him know the results of the labs and the aspiration once we have those results.  All questions and concerns were answered and addressed.  Follow-Up Instructions: Return in about 2 weeks (around 06/30/2020).   Orders:  Orders Placed This Encounter  Procedures   XR HIP UNILAT W OR W/O PELVIS 1V LEFT   Meds ordered this encounter  Medications   HYDROcodone-acetaminophen (NORCO/VICODIN) 5-325 MG tablet    Sig: Take 1-2 tablets by mouth every 6 (six) hours as needed for moderate pain.    Dispense:  30 tablet    Refill:  0   diclofenac (VOLTAREN) 75 MG EC tablet    Sig: Take 1 tablet (75 mg total) by mouth 2 (two) times daily between meals as needed.    Dispense:  60 tablet    Refill:  1      Procedures: No procedures performed   Clinical Data: No additional findings.   Subjective: Chief Complaint  Patient presents with   Left Hip - Follow-up   Lower Back - Follow-up  Sergio Parker comes in today for continued follow-up of his left hip.  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  He had never had any issues with his left hip until March of this year.  He had a significant twisting injury and this did affect the left hip.  In June he then fell again landing hard on the left hip.  He has had significant pain to the point where it does hurt with weightbearing.  His x-rays in March of this year were negative for any type of acute finding of the hip and this was compared to films from 2013 as well.  With that being said due to the severity of his pain, I sent her for an MRI of the left hip.  He is here for review this today.  He is needing crutches for weightbearing at times.  He denies any fever or chills or recent illnesses.  He did have a root canal earlier this year but there was no abscess.  He had not had any hip pain at all since the replacement over an 8-year period of time.  He is not a sickly individual and is very healthy.  He is not a diabetic and not a smoker.  HPI  Review of Systems He currently denies any headache, chest pain, shortness of breath, fever, chills, nausea, vomiting  Objective: Vital Signs: There were no vitals taken for this visit.  Physical Exam He is alert and oriented x3 and in no acute distress Ortho Exam Examination of his left lower extremity shows no swelling or redness anywhere around the hip or the leg at all.  There is actually muscle atrophy when you compare this to thighs to each other.  I can put his hip gently through internal extra rotation and he only hurts with the extremes of internal rotation and its in the groin area.  He states that it feels like it did when he first had pain from his avascular necrosis.  He is not sick appearing at all.  His heart rate is normal. Specialty Comments:  No specialty comments available.  Imaging: No results found. The MRI is reviewed and does show significant edema all around the left side of the pelvis and the  acetabular component.  There is evidence of muscle tears of the obturator internus and the rotators.  There is no significant fluid collection in the joint itself.  The radiologist describes a fracture line at the superior pubic ramus near the acetabulum.  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[place]) 09/20/2012   Avascular necrosis of hip ([place]) 01/12/2012   Past Medical History:  Diagnosis Date   Arthritis    Avascular necrosis ([place])    ED (erectile dysfunction)    Hypertension    PCP Dr Osborne Casco   Hypertriglyceridemia    Microalbuminuria    Obesity     Family History  Problem Relation Age of Onset   Hypertension Mother    Hypertension Father    Colon cancer Neg Hx     Past Surgical History:  Procedure Laterality Date   MOUTH SURGERY     TOTAL HIP ARTHROPLASTY  01/12/2012   Procedure: TOTAL HIP ARTHROPLASTY ANTERIOR APPROACH;  Surgeon: Mcarthur Rossetti, MD;  Location: [facility];  Service: Orthopedics;  Laterality: Left;  Left Total Hip Arthroplasty, Anterior Approach   TOTAL HIP ARTHROPLASTY  09/20/2012   Procedure: TOTAL HIP ARTHROPLASTY ANTERIOR APPROACH;  Surgeon: Mcarthur Rossetti, MD;  Location: [facility];  Service: Orthopedics;  Laterality: Right;   Social History   Occupational History   Not on file  Tobacco Use   Smoking status: Former Smoker    Types: Cigars   Smokeless tobacco: Current User    Types: Snuff   Tobacco comment: 1-2 cigars day years ago  Substance and Sexual Activity   Alcohol use: Yes    Alcohol/week: 28.0 standard drinks    Types: 28 Cans of beer per week    Comment: "4 beers per day" per pt/RM   Drug use: No   Sexual activity: Never

## 2020-06-17 ENCOUNTER — Other Ambulatory Visit: Payer: BC Managed Care – PPO

## 2020-06-17 ENCOUNTER — Ambulatory Visit: Payer: Self-pay

## 2020-06-17 ENCOUNTER — Encounter: Payer: Self-pay | Admitting: Physical Medicine and Rehabilitation

## 2020-06-17 ENCOUNTER — Other Ambulatory Visit: Payer: Self-pay

## 2020-06-17 ENCOUNTER — Ambulatory Visit (INDEPENDENT_AMBULATORY_CARE_PROVIDER_SITE_OTHER): Payer: BC Managed Care – PPO | Admitting: Physical Medicine and Rehabilitation

## 2020-06-17 DIAGNOSIS — M25552 Pain in left hip: Secondary | ICD-10-CM

## 2020-06-17 DIAGNOSIS — M25551 Pain in right hip: Secondary | ICD-10-CM

## 2020-06-17 DIAGNOSIS — Z96642 Presence of left artificial hip joint: Secondary | ICD-10-CM | POA: Diagnosis not present

## 2020-06-17 LAB — CBC WITH DIFFERENTIAL/PLATELET
Absolute Monocytes: 392 cells/uL (ref 200–950)
Basophils Absolute: 58 cells/uL (ref 0–200)
Basophils Relative: 1.1 %
Eosinophils Absolute: 191 cells/uL (ref 15–500)
Eosinophils Relative: 3.6 %
HCT: 39.3 % (ref 38.5–50.0)
Hemoglobin: 13 g/dL — ABNORMAL LOW (ref 13.2–17.1)
Lymphs Abs: 1108 cells/uL (ref 850–3900)
MCH: 28.4 pg (ref 27.0–33.0)
MCHC: 33.1 g/dL (ref 32.0–36.0)
MCV: 86 fL (ref 80.0–100.0)
MPV: 9.2 fL (ref 7.5–12.5)
Monocytes Relative: 7.4 %
Neutro Abs: 3551 cells/uL (ref 1500–7800)
Neutrophils Relative %: 67 %
Platelets: 168 10*3/uL (ref 140–400)
RBC: 4.57 10*6/uL (ref 4.20–5.80)
RDW: 12.3 % (ref 11.0–15.0)
Total Lymphocyte: 20.9 %
WBC: 5.3 10*3/uL (ref 3.8–10.8)

## 2020-06-17 LAB — C-REACTIVE PROTEIN: CRP: 25.3 mg/L — ABNORMAL HIGH (ref <8.0)

## 2020-06-17 LAB — SEDIMENTATION RATE: Sed Rate: 63 mm/h — ABNORMAL HIGH (ref 0–20)

## 2020-06-17 NOTE — Progress Notes (Signed)
Pt state left hip and groin pain that travel to his buttock down to his knee. Pt state twisting and turning his feet makes the pain worse. Pt state staying off his feet helps with pain.   Numeric Pain Rating Scale and Functional Assessment Average Pain 2   In the last MONTH (on 0-10 scale) has pain interfered with the following?  1. General activity like being  able to carry out your everyday physical activities such as walking, climbing stairs, carrying groceries, or moving a chair?  Rating(10)   +Driver, -BT, -Dye Allergies.

## 2020-06-17 NOTE — Progress Notes (Signed)
Sergio Parker - 54 y.o. male MRN [id number]  Date of birth: [birth year]  Office Visit Note: Visit Date: 06/17/2020 PCP: Sergio Pao, MD Referred by: Sergio Pao, MD  Subjective: Chief Complaint  Patient presents with  . Left Hip - Pain   HPI: Sergio Parker is a 54 y.o. male who comes in today at the request of Sergio Parker for planned Left hip aspiration with fluoroscopic guidance. Please see requesting physician notes for further details and justification.   ROS Otherwise per HPI.  Assessment & Plan: Visit Diagnoses:  1. Pain in right hip     Plan: No additional findings.   Meds & Orders: No orders of the defined types were placed in this encounter.   Orders Placed This Encounter  Procedures  . Large Joint Inj  . XR C-ARM NO REPORT    Follow-up: Return for Sergio Rosenthal, MD as scheduled.   Procedures: Right aspiration, Large Joint Inj: R hip joint on 06/17/2020 2:49 PM Indications: diagnostic evaluation and pain Details: (20 G Tuohy) 3.5 in needle, fluoroscopy-guided anterior approach  Arthrogram: No  Aspirate: 3 mL serous, cloudy and blood-tinged; sent for lab analysis Outcome: tolerated well, no immediate complications  30ml of fluid obtained, labs ordered per Sergio Parker. Procedure, treatment alternatives, risks and benefits explained, specific risks discussed. Consent was given by the patient. Immediately prior to procedure a time out was called to verify the correct patient, procedure, equipment, support staff and site/side marked as required. Patient was prepped and draped in the usual sterile fashion.      No notes on file   Clinical History: No specialty comments available.   He reports that he has quit smoking. His smoking use included cigars. His smokeless tobacco use includes snuff. No results for input(s): HGBA1C, LABURIC in the last 8760 hours.  Objective:  VS:  HT:    WT:   BMI:     BP:   HR: bpm  TEMP:  ( )  RESP:  Physical Exam Constitutional:      General: He is not in acute distress.    Appearance: Normal appearance. He is not ill-appearing.  HENT:     Head: Normocephalic and atraumatic.     Right Ear: External ear normal.     Left Ear: External ear normal.  Eyes:     Extraocular Movements: Extraocular movements intact.  Cardiovascular:     Rate and Rhythm: Normal rate.     Pulses: Normal pulses.  Abdominal:     General: There is no distension.     Palpations: Abdomen is soft.  Musculoskeletal:        General: No tenderness or signs of injury.     Right lower leg: No edema.     Left lower leg: No edema.     Comments: Patient has good distal strength without clonus. Well healed anterior hip surgical scar. No redness or induration. Walks with one crutch.  Skin:    Findings: No erythema or rash.  Neurological:     General: No focal deficit present.     Mental Status: He is alert and oriented to person, place, and time.     Sensory: No sensory deficit.     Motor: No weakness or abnormal muscle tone.     Coordination: Coordination normal.  Psychiatric:        Mood and Affect: Mood normal.        Behavior: Behavior normal.     Ortho  Exam  Imaging: No results found.  Past Medical/Family/Surgical/Social History: Medications & Allergies reviewed per EMR, new medications updated. Patient Active Problem List   Diagnosis Date Noted  . Avascular necrosis of bones of both hips (Sergio Parker) 09/20/2012  . Avascular necrosis of hip (Sergio Parker) 01/12/2012   Past Medical History:  Diagnosis Date  . Arthritis   . Avascular necrosis (Sergio Parker)   . ED (erectile dysfunction)   . Hypertension    PCP Dr Sergio Parker  . Hypertriglyceridemia   . Microalbuminuria   . Obesity    Family History  Problem Relation Age of Onset  . Hypertension Mother   . Hypertension Father   . Colon cancer Neg Hx    Past Surgical History:  Procedure Laterality Date  . MOUTH SURGERY    . TOTAL HIP ARTHROPLASTY   01/12/2012   Procedure: TOTAL HIP ARTHROPLASTY ANTERIOR APPROACH;  Surgeon: Sergio Rossetti, MD;  Location: [facility];  Service: Orthopedics;  Laterality: Left;  Left Total Hip Arthroplasty, Anterior Approach  . TOTAL HIP ARTHROPLASTY  09/20/2012   Procedure: TOTAL HIP ARTHROPLASTY ANTERIOR APPROACH;  Surgeon: Sergio Rossetti, MD;  Location: [facility];  Service: Orthopedics;  Laterality: Right;   Social History   Occupational History  . Not on file  Tobacco Use  . Smoking status: Former Smoker    Types: Cigars  . Smokeless tobacco: Current User    Types: Snuff  . Tobacco comment: 1-2 cigars day years ago  Substance and Sexual Activity  . Alcohol use: Yes    Alcohol/week: 28.0 standard drinks    Types: 28 Cans of beer per week    Comment: "4 beers per day" per pt/RM  . Drug use: No  . Sexual activity: Never

## 2020-06-23 LAB — ANAEROBIC AND AEROBIC CULTURE
AER RESULT:: NO GROWTH
MICRO NUMBER:: 10876650
MICRO NUMBER:: 10876651
SPECIMEN QUALITY:: ADEQUATE
SPECIMEN QUALITY:: ADEQUATE

## 2020-06-23 LAB — TIQ-NTM

## 2020-06-30 ENCOUNTER — Encounter: Payer: Self-pay | Admitting: Orthopaedic Surgery

## 2020-06-30 ENCOUNTER — Ambulatory Visit: Payer: BC Managed Care – PPO | Admitting: Orthopaedic Surgery

## 2020-06-30 DIAGNOSIS — M25552 Pain in left hip: Secondary | ICD-10-CM

## 2020-06-30 MED ORDER — METHOCARBAMOL 750 MG PO TABS: 750.0000 mg | ORAL_TABLET | Freq: Four times a day (QID) | ORAL | 1 refills | Status: DC | PRN

## 2020-06-30 NOTE — Progress Notes (Signed)
Sergio Parker comes in today for continued follow-up of his left hip.  He had 2 traumatic events with one in March and and another fall in June both of this year landing on his left hip.  He is 8 years into bilateral hip replacements.  His left hip has never been a problem until this mechanical event.  X-rays were negative.  However, his CRP and sed rate were elevated and his peripheral white blood cell count was normal.  We did obtain a hip aspiration under fluoroscopy.  Hip aspiration showed no organisms and the cultures never grew out anything.  He has some days where he does not hurt much at all and other days that he hurts after significant weightbearing on that hip.  We did have an MRI of that left hip and it showed significant edema all around the acetabular area of the hip.  There was tearing of muscles and a small fracture line in the superior rami area near the hip acetabular component.  He is still weightbearing as tolerated.  On exam I can put his hip through internal and external rotation of the left side with no pain at all.  Compressing the hip has no pain today.  His incision looks normal and there is no induration of the skin or warmth or redness.  He will continue his anti-inflammatories and muscle relaxants.  I want him to offload his hip as much as possible over the next 4 weeks.  I would like to see him back in 4 weeks from now and at that point we may repeat his CRP and sed rate as well as consider repeating a MRI of that left hip.  Obviously if things worsen at all he knows to let us know.

## 2020-07-09 DIAGNOSIS — E781 Pure hyperglyceridemia: Secondary | ICD-10-CM | POA: Diagnosis not present

## 2020-07-09 DIAGNOSIS — Z125 Encounter for screening for malignant neoplasm of prostate: Secondary | ICD-10-CM | POA: Diagnosis not present

## 2020-07-09 DIAGNOSIS — Z0000 Encounter for general adult medical examination without abnormal findings: Secondary | ICD-10-CM | POA: Diagnosis not present

## 2020-07-16 DIAGNOSIS — Z23 Encounter for immunization: Secondary | ICD-10-CM | POA: Diagnosis not present

## 2020-07-16 DIAGNOSIS — R82998 Other abnormal findings in urine: Secondary | ICD-10-CM | POA: Diagnosis not present

## 2020-07-16 DIAGNOSIS — I10 Essential (primary) hypertension: Secondary | ICD-10-CM | POA: Diagnosis not present

## 2020-07-16 DIAGNOSIS — Z0000 Encounter for general adult medical examination without abnormal findings: Secondary | ICD-10-CM | POA: Diagnosis not present

## 2020-07-19 ENCOUNTER — Telehealth: Payer: Self-pay | Admitting: Orthopaedic Surgery

## 2020-07-19 NOTE — Telephone Encounter (Signed)
Last 2 ov notes faxed to [facility], AttnLyndee Leo [phone], ph [phone] ext [phone]

## 2020-07-20 DIAGNOSIS — Z1212 Encounter for screening for malignant neoplasm of rectum: Secondary | ICD-10-CM | POA: Diagnosis not present

## 2020-07-28 ENCOUNTER — Ambulatory Visit: Payer: BC Managed Care – PPO | Admitting: Orthopaedic Surgery

## 2020-07-28 ENCOUNTER — Encounter: Payer: Self-pay | Admitting: Orthopaedic Surgery

## 2020-07-28 DIAGNOSIS — M25552 Pain in left hip: Secondary | ICD-10-CM | POA: Diagnosis not present

## 2020-07-28 NOTE — Progress Notes (Signed)
Sergio Parker continues to experience left hip pain after a traumatic event to his left hip. He has a hip arthroplasty that is been in for a long period of time with no issues at all until earlier this year when knee twisted that hip quite significantly getting out of a chair and fell really hard and then a few months later fell forward again on that hip. He walks without assistive device. His pain does occur daily but it is more activity related. He does not have a constant pain. It does not hurt with an activity. He still denies any fever, chills, nausea, vomiting. In August of this year, a MRI was obtained of the left hip showing significant tearing of the muscles around the hip and some nondisplaced fractures of the pelvis. His CRP and sed rate were elevated but his white blood cell count was normal. He has been on anti-inflammatories as well.  Examination of his left hip does show pain on extremes of rotation. There is no evidence of induration of the skin or redness or warmth. He is not sick appearing and is heart rate is normal. Fortunately he has not been on any antibiotics as well. He is an otherwise very healthy individual.  I would like to see him back in 4 weeks to see how he is doing overall. At that visit we will obtain labs including a CBC and sed rate as well as a CRP. Will likely repeat a MRI of his left hip and consider a three-phase bone scan as well. All questions and nerves were answered and addressed. He will still try to limit significant activities without left hip.

## 2020-08-07 ENCOUNTER — Other Ambulatory Visit: Payer: Self-pay | Admitting: Orthopaedic Surgery

## 2020-08-09 NOTE — Telephone Encounter (Signed)
Please advise 

## 2020-08-25 ENCOUNTER — Encounter: Payer: Self-pay | Admitting: Orthopaedic Surgery

## 2020-08-25 ENCOUNTER — Ambulatory Visit: Payer: BC Managed Care – PPO | Admitting: Orthopaedic Surgery

## 2020-08-25 DIAGNOSIS — M25552 Pain in left hip: Secondary | ICD-10-CM

## 2020-08-25 NOTE — Progress Notes (Signed)
Sergio Parker is coming in for follow-up due to an injury to his left hip.  He has a left total hip arthroplasty we did about 8 years ago.  He then had 2 different injuries this year.  He did have labs and a MRI they were suspicious for infection but it looked to be more suspicious for trauma with muscle tears and a hairline fracture.  He reports that each month he is feeling better.  Was never on antibiotics for him he is never had any fever or chills.  He still has occasional episode of having to walk with a limp and some pain but overall he is much better.  His MRI was in August.  On exam his left hip still moves smoothly and fluidly.  There is minimal pain.  We will order a new CBC, sed rate and CRP today.  I will hold off on ordering any new MRI based on how he is doing clinically and improvements he is making.  He also wants to hold off as well.  If there is something significantly irregular in his labs I will give him a call.  We will put for follow-up appoint with [name] in 3 months.  I would like an AP and lateral of the left hip at that visit.

## 2020-08-25 NOTE — Addendum Note (Signed)
Addended by: Jacklyn Shell on: 08/25/2020 11:10 AM   Modules accepted: Orders

## 2020-08-26 LAB — CBC WITH DIFFERENTIAL/PLATELET
Absolute Monocytes: 410 cells/uL (ref 200–950)
Basophils Absolute: 51 cells/uL (ref 0–200)
Basophils Relative: 0.9 %
Eosinophils Absolute: 131 cells/uL (ref 15–500)
Eosinophils Relative: 2.3 %
HCT: 43.4 % (ref 38.5–50.0)
Hemoglobin: 13.8 g/dL (ref 13.2–17.1)
Lymphs Abs: 1123 cells/uL (ref 850–3900)
MCH: 26.8 pg — ABNORMAL LOW (ref 27.0–33.0)
MCHC: 31.8 g/dL — ABNORMAL LOW (ref 32.0–36.0)
MCV: 84.4 fL (ref 80.0–100.0)
MPV: 9.6 fL (ref 7.5–12.5)
Monocytes Relative: 7.2 %
Neutro Abs: 3984 cells/uL (ref 1500–7800)
Neutrophils Relative %: 69.9 %
Platelets: 178 10*3/uL (ref 140–400)
RBC: 5.14 10*6/uL (ref 4.20–5.80)
RDW: 15 % (ref 11.0–15.0)
Total Lymphocyte: 19.7 %
WBC: 5.7 10*3/uL (ref 3.8–10.8)

## 2020-08-26 LAB — SEDIMENTATION RATE: Sed Rate: 41 mm/h — ABNORMAL HIGH (ref 0–20)

## 2020-08-26 LAB — C-REACTIVE PROTEIN: CRP: 35.4 mg/L — ABNORMAL HIGH (ref <8.0)

## 2020-08-30 ENCOUNTER — Other Ambulatory Visit: Payer: Self-pay

## 2020-08-30 DIAGNOSIS — M25552 Pain in left hip: Secondary | ICD-10-CM

## 2020-08-30 DIAGNOSIS — M87052 Idiopathic aseptic necrosis of left femur: Secondary | ICD-10-CM

## 2020-08-30 DIAGNOSIS — M87051 Idiopathic aseptic necrosis of right femur: Secondary | ICD-10-CM

## 2020-09-01 ENCOUNTER — Telehealth: Payer: Self-pay | Admitting: Orthopaedic Surgery

## 2020-09-01 NOTE — Telephone Encounter (Signed)
Pt called stating someone called him (did not leave a name) asking his preference of MRI facilities and the pt states he would like to go to [facility] radi[facility]  [phone]

## 2020-09-02 NOTE — Telephone Encounter (Signed)
Pt aware order was sent to [facility] and will wait for their call

## 2020-09-11 DIAGNOSIS — M25452 Effusion, left hip: Secondary | ICD-10-CM | POA: Diagnosis not present

## 2020-09-11 DIAGNOSIS — Z96643 Presence of artificial hip joint, bilateral: Secondary | ICD-10-CM | POA: Diagnosis not present

## 2020-09-13 ENCOUNTER — Telehealth: Payer: Self-pay

## 2020-09-13 NOTE — Telephone Encounter (Signed)
Pam with [facility] would like to know if Dr. Ninfa Linden has received MRI report of patient's left hip?  Cb# [phone].  Please advise.  Thank you.

## 2020-09-13 NOTE — Telephone Encounter (Signed)
I have not seen anything yet.  I have only seen the old MRI from them in [place] that was done in August.  Nothing recent.

## 2020-09-13 NOTE — Telephone Encounter (Signed)
I haven't seen it yet, have you?

## 2020-09-14 NOTE — Telephone Encounter (Signed)
Yes, I have it. Do you need now?

## 2020-09-14 NOTE — Telephone Encounter (Signed)
Have you seen this by chance?

## 2020-09-14 NOTE — Telephone Encounter (Signed)
Whenever you come down No rush

## 2020-09-27 ENCOUNTER — Other Ambulatory Visit: Payer: Self-pay

## 2020-10-04 NOTE — Progress Notes (Signed)
Pt. Needs orders for upcomming surgery.PAT and labs. appointment on 10/05/20.Thanks

## 2020-10-04 NOTE — Patient Instructions (Addendum)
DUE TO COVID-19 ONLY ONE VISITOR IS ALLOWED TO COME WITH YOU AND STAY IN THE WAITING ROOM ONLY DURING PRE OP AND PROCEDURE DAY OF SURGERY. THE 1 VISITOR  MAY VISIT WITH YOU AFTER SURGERY IN YOUR PRIVATE ROOM DURING VISITING HOURS ONLY!  YOU NEED TO HAVE A COVID 19 TEST ON: 10/05/20, THIS TEST MUST BE DONE BEFORE SURGERY,  COVID TESTING SITE [address] [place] [address], IT IS ON THE RIGHT GOING OUT [address] APPROXIMATELY  2 MINUTES PAST [employer] ON THE RIGHT. ONCE YOUR COVID TEST IS COMPLETED,  PLEASE BEGIN THE QUARANTINE INSTRUCTIONS AS OUTLINED IN YOUR HANDOUT.                Hedwig Morton   Your procedure is scheduled on: 10/08/20   Report to [facility] Main  Entrance   Report to admitting at: 11:30 AM    Call this number if you have problems the morning of surgery [phone]    Remember: NO SOLID FOOD AFTER MIDNIGHT THE NIGHT PRIOR TO SURGERY. NOTHING BY MOUTH EXCEPT CLEAR LIQUIDS UNTIL: 10:30 AM . PLEASE FINISH ENSURE DRINK PER SURGEON ORDER  WHICH NEEDS TO BE COMPLETED AT: 10:30 AM.  CLEAR LIQUID DIET   Foods Allowed                                                                     Foods Excluded  Coffee and tea, regular and decaf                             liquids that you cannot  Plain Jell-O any favor except red or purple                                           see through such as: Fruit ices (not with fruit pulp)                                     milk, soups, orange juice  Iced Popsicles                                    All solid food Carbonated beverages, regular and diet                                    Cranberry, grape and apple juices Sports drinks like Gatorade Lightly seasoned clear broth or consume(fat free) Sugar, honey syrup  Sample Menu Breakfast                                Lunch  Supper Cranberry juice                    Beef broth                             Chicken broth Jell-O                                     Grape juice                           Apple juice Coffee or tea                        Jell-O                                      Popsicle                                                Coffee or tea                        Coffee or tea  _____________________________________________________________________  BRUSH YOUR TEETH MORNING OF SURGERY AND RINSE YOUR MOUTH OUT, NO CHEWING GUM CANDY OR MINTS.                               You may not have any metal on your body including hair pins and              piercings  Do not wear jewelry, lotions, powders or perfumes, deodorant             Men may shave face and neck.   Do not bring valuables to the hospital. [place].  Contacts, dentures or bridgework may not be worn into surgery.  Leave suitcase in the car. After surgery it may be brought to your room.     Patients discharged the day of surgery will not be allowed to drive home. IF YOU ARE HAVING SURGERY AND GOING HOME THE SAME DAY, YOU MUST HAVE AN ADULT TO DRIVE YOU HOME AND BE WITH YOU FOR 24 HOURS. YOU MAY GO HOME BY TAXI OR UBER OR ORTHERWISE, BUT AN ADULT MUST ACCOMPANY YOU HOME AND STAY WITH YOU FOR 24 HOURS.  Name and phone number of your driver:  Special Instructions: N/A              Please read over the following fact sheets you were given: _____________________________________________________________________          [facility] - Preparing for Surgery Before surgery, you can play an important role.  Because skin is not sterile, your skin needs to be as free of germs as possible.  You can reduce the number of germs on your skin by washing with CHG (chlorahexidine gluconate) soap before surgery.  CHG is an antiseptic cleaner which kills germs and bonds with the  skin to continue killing germs even after washing. Please DO NOT use if you have an allergy to CHG or  antibacterial soaps.  If your skin becomes reddened/irritated stop using the CHG and inform your nurse when you arrive at Short Stay. Do not shave (including legs and underarms) for at least 48 hours prior to the first CHG shower.  You may shave your face/neck. Please follow these instructions carefully:  1.  Shower with CHG Soap the night before surgery and the  morning of Surgery.  2.  If you choose to wash your hair, wash your hair first as usual with your  normal  shampoo.  3.  After you shampoo, rinse your hair and body thoroughly to remove the  shampoo.                           4.  Use CHG as you would any other liquid soap.  You can apply chg directly  to the skin and wash                       Gently with a scrungie or clean washcloth.  5.  Apply the CHG Soap to your body ONLY FROM THE NECK DOWN.   Do not use on face/ open                           Wound or open sores. Avoid contact with eyes, ears mouth and genitals (private parts).                       Wash face,  Genitals (private parts) with your normal soap.             6.  Wash thoroughly, paying special attention to the area where your surgery  will be performed.  7.  Thoroughly rinse your body with warm water from the neck down.  8.  DO NOT shower/wash with your normal soap after using and rinsing off  the CHG Soap.                9.  Pat yourself dry with a clean towel.            10.  Wear clean pajamas.            11.  Place clean sheets on your bed the night of your first shower and do not  sleep with pets. Day of Surgery : Do not apply any lotions/deodorants the morning of surgery.  Please wear clean clothes to the hospital/surgery center.  FAILURE TO FOLLOW THESE INSTRUCTIONS MAY RESULT IN THE CANCELLATION OF YOUR SURGERY PATIENT SIGNATURE_________________________________  NURSE SIGNATURE__________________________________  ________________________________________________________________________   Adam Phenix  An incentive spirometer is a tool that can help keep your lungs clear and active. This tool measures how well you are filling your lungs with each breath. Taking long deep breaths may help reverse or decrease the chance of developing breathing (pulmonary) problems (especially infection) following:  A long period of time when you are unable to move or be active. BEFORE THE PROCEDURE   If the spirometer includes an indicator to show your best effort, your nurse or respiratory therapist will set it to a desired goal.  If possible, sit up straight or lean slightly forward. Try not to slouch.  Hold the incentive spirometer in an upright position. INSTRUCTIONS FOR USE  1. Sit on the edge of your bed if possible, or sit up as far as you can in bed or on a chair. 2. Hold the incentive spirometer in an upright position. 3. Breathe out normally. 4. Place the mouthpiece in your mouth and seal your lips tightly around it. 5. Breathe in slowly and as deeply as possible, raising the piston or the ball toward the top of the column. 6. Hold your breath for 3-5 seconds or for as long as possible. Allow the piston or ball to fall to the bottom of the column. 7. Remove the mouthpiece from your mouth and breathe out normally. 8. Rest for a few seconds and repeat Steps 1 through 7 at least 10 times every 1-2 hours when you are awake. Take your time and take a few normal breaths between deep breaths. 9. The spirometer may include an indicator to show your best effort. Use the indicator as a goal to work toward during each repetition. 10. After each set of 10 deep breaths, practice coughing to be sure your lungs are clear. If you have an incision (the cut made at the time of surgery), support your incision when coughing by placing a pillow or rolled up towels firmly against it. Once you are able to get out of bed, walk around indoors and cough well. You may stop using the incentive spirometer when  instructed by your caregiver.  RISKS AND COMPLICATIONS  Take your time so you do not get dizzy or light-headed.  If you are in pain, you may need to take or ask for pain medication before doing incentive spirometry. It is harder to take a deep breath if you are having pain. AFTER USE  Rest and breathe slowly and easily.  It can be helpful to keep track of a log of your progress. Your caregiver can provide you with a simple table to help with this. If you are using the spirometer at home, follow these instructions: [place] IF:   You are having difficultly using the spirometer.  You have trouble using the spirometer as often as instructed.  Your pain medication is not giving enough relief while using the spirometer.  You develop fever of 100.5 F (38.1 C) or higher. SEEK IMMEDIATE MEDICAL CARE IF:   You cough up bloody sputum that had not been present before.  You develop fever of 102 F (38.9 C) or greater.  You develop worsening pain at or near the incision site. MAKE SURE YOU:   Understand these instructions.  Will watch your condition.  Will get help right away if you are not doing well or get worse. Document Released: 02/19/2007 Document Revised: 01/01/2012 Document Reviewed: 04/22/2007 [facility] Patient Information 2014 [place], [place].   ________________________________________________________________________

## 2020-10-05 ENCOUNTER — Other Ambulatory Visit (HOSPITAL_COMMUNITY)
Admission: RE | Admit: 2020-10-05 | Discharge: 2020-10-05 | Disposition: A | Discharge status: Home / Self Care | Payer: BC Managed Care – PPO | Source: Ambulatory Visit | Attending: Orthopaedic Surgery | Admitting: Orthopaedic Surgery

## 2020-10-05 ENCOUNTER — Encounter (HOSPITAL_COMMUNITY): Payer: Self-pay

## 2020-10-05 ENCOUNTER — Encounter (HOSPITAL_COMMUNITY)
Admission: RE | Admit: 2020-10-05 | Discharge: 2020-10-05 | Disposition: A | Discharge status: Home / Self Care | Payer: BC Managed Care – PPO | Source: Ambulatory Visit | Attending: Orthopaedic Surgery | Admitting: Orthopaedic Surgery

## 2020-10-05 ENCOUNTER — Other Ambulatory Visit: Payer: Self-pay | Admitting: Physician Assistant

## 2020-10-05 ENCOUNTER — Other Ambulatory Visit: Payer: Self-pay | Admitting: Orthopaedic Surgery

## 2020-10-05 ENCOUNTER — Other Ambulatory Visit: Payer: Self-pay

## 2020-10-05 DIAGNOSIS — Z01812 Encounter for preprocedural laboratory examination: Secondary | ICD-10-CM | POA: Insufficient documentation

## 2020-10-05 DIAGNOSIS — Z01818 Encounter for other preprocedural examination: Secondary | ICD-10-CM | POA: Insufficient documentation

## 2020-10-05 DIAGNOSIS — Z20822 Contact with and (suspected) exposure to covid-19: Secondary | ICD-10-CM | POA: Insufficient documentation

## 2020-10-05 LAB — CBC
HCT: 42.2 % (ref 39.0–52.0)
Hemoglobin: 13.3 g/dL (ref 13.0–17.0)
MCH: 27.5 pg (ref 26.0–34.0)
MCHC: 31.5 g/dL (ref 30.0–36.0)
MCV: 87.4 fL (ref 80.0–100.0)
Platelets: 315 10*3/uL (ref 150–400)
RBC: 4.83 MIL/uL (ref 4.22–5.81)
RDW: 14 % (ref 11.5–15.5)
WBC: 4.5 10*3/uL (ref 4.0–10.5)
nRBC: 0 % (ref 0.0–0.2)

## 2020-10-05 LAB — SARS CORONAVIRUS 2 (TAT 6-24 HRS): SARS Coronavirus 2: NEGATIVE

## 2020-10-05 LAB — BASIC METABOLIC PANEL
Anion gap: 8 (ref 5–15)
BUN: 15 mg/dL (ref 6–20)
CO2: 30 mmol/L (ref 22–32)
Calcium: 9.2 mg/dL (ref 8.9–10.3)
Chloride: 102 mmol/L (ref 98–111)
Creatinine, Ser: 0.76 mg/dL (ref 0.61–1.24)
GFR, Estimated: >60 mL/min (ref >60)
Glucose, Bld: 140 mg/dL — ABNORMAL HIGH (ref 70–99)
Potassium: 4.5 mmol/L (ref 3.5–5.1)
Sodium: 140 mmol/L (ref 135–145)

## 2020-10-05 LAB — SURGICAL PCR SCREEN
MRSA, PCR: NEGATIVE
Staphylococcus aureus: NEGATIVE

## 2020-10-05 NOTE — Progress Notes (Addendum)
COVID Vaccine Completed: Yes Date COVID Vaccine completed: 12/2019 COVID vaccine manufacturer: Pfizer   PCP - Dr. Domenick Gong Cardiologist - No   Chest x-ray -  EKG -  Stress Test -  ECHO -  Cardiac Cath -  Pacemaker/ICD device last checked:  Sleep Study - YES CPAP - NO  Fasting Blood Sugar -  Checks Blood Sugar _____ times a day  Blood Thinner Instructions: Aspirin Instructions: Last Dose:  Anesthesia review: Hx: HTN,OSA(NO CPAP)  Patient denies shortness of breath, fever, cough and chest pain at PAT appointment   Patient verbalized understanding of instructions that were given to them at the PAT appointment. Patient was also instructed that they will need to review over the PAT instructions again at home before surgery.

## 2020-10-07 DIAGNOSIS — M25552 Pain in left hip: Secondary | ICD-10-CM

## 2020-10-07 NOTE — Progress Notes (Signed)
Pt aware to arrive at [facility] admitting at 11 am on Friday 10/08/2020 for scheduled surgical procedure.

## 2020-10-07 NOTE — H&P (Signed)
Sergio Parker is an 54 y.o. male.   Chief Complaint: Left hip pain HPI: Sergio Parker is a 54 year old male well-known to me.  I have known him for 9 years or so.  Actually replaced both hips a few months apart just over 8 years ago.  This was secondary to avascular necrosis.  His hips were replaced through a direct anterior approach.  He has had no problems with either hip for the last 8 years until March of this year.  He was getting out of a chair and somehow got out awkwardly and twisted getting his foot caught and feeling a pop in his left hip.  Prior to that he had not had any issues.  He went to the emergency room at that point and x-rays were obtained of the left hip showing no obvious acute abnormalities of the hip or the hip arthroplasty.  He was able to weight-bear but did have pain with weightbearing.  He reports that he fell directly on the right hip in June of this year.  I then saw him and he was feeling better but would have good days and bad days in terms of days where he would hurt quite a bit with weightbearing on his left hip and it would occur more the end of the day.  He never showed signs of any type of infection systemically.  He has consistently denied any fever, chills, nausea, vomiting.  He has never had swelling around his left hip and every exam that I have performed on his left hip has been normal in terms of range of motion and no pain in the groin when I compress his hip.  His incision is healed over nicely and he is showing no outward abnormality.  By August of this year he would still have days where his hip would hurt but then days with the hip would not hurt.  I had an aspiration performed of that hip which was negative for any organisms.  He has never been on antibiotics at all.  I did obtain labs and his peripheral white blood cell count was normal but his CRP and sed rate were extensively elevated.  He has had no other systemic illnesses.  A MRI was obtained of the left hip in  August showing extensive edema around the hip on the left side and tearing of muscles and even the potential fracture line in the acetabulum suggesting significant trauma to the hip.  I continue to follow him along conservatively since he continued to report better days and decreased hip pain.  In November I decided to repeat his labs and a MRI.  His white blood cell count peripherally was still normal and his sed rate was elevated but much lower than previous.  However his CRP stays elevated.  I repeated the MRI of his left hip and actually shows worsening edema around the left hip with fluid collection that may be consistent with infection.  There is a possibility that this could be a pseudotumor as well.  At this point, I have recommended an exploration of his left hip given the MRI findings.  He is not fully pain-free either.  I had a long and thorough discussion with him in detail about surgery on his hip.  He agrees at this point this is what we need to consider.  Past Medical History:  Diagnosis Date   Arthritis    Avascular necrosis [facility])    ED (erectile dysfunction)    Hypertension  PCP Dr Osborne Casco   Hypertriglyceridemia    Microalbuminuria    Obesity     Past Surgical History:  Procedure Laterality Date   MOUTH SURGERY     TOTAL HIP ARTHROPLASTY  01/12/2012   Procedure: TOTAL HIP ARTHROPLASTY ANTERIOR APPROACH;  Surgeon: Mcarthur Rossetti, MD;  Location: [facility] ORS;  Service: Orthopedics;  Laterality: Left;  Left Total Hip Arthroplasty, Anterior Approach   TOTAL HIP ARTHROPLASTY  09/20/2012   Procedure: TOTAL HIP ARTHROPLASTY ANTERIOR APPROACH;  Surgeon: Mcarthur Rossetti, MD;  Location: [facility] ORS;  Service: Orthopedics;  Laterality: Right;    Family History  Problem Relation Age of Onset   Hypertension Mother    Hypertension Father    Colon cancer Neg Hx    Social History:  reports that he has quit smoking. His smoking use included cigars. His smokeless tobacco  use includes snuff. He reports current alcohol use of about 28.0 standard drinks of alcohol per week. He reports that he does not use drugs.  Allergies: No Known Allergies  No medications prior to admission.    No results found for this or any previous visit (from the past 48 hour(s)). No results found.  Review of Systems  All other systems reviewed and are negative.   There were no vitals taken for this visit. Physical Exam Vitals reviewed.  Constitutional:      Appearance: Normal appearance.  HENT:     Head: Normocephalic and atraumatic.  Eyes:     Extraocular Movements: Extraocular movements intact.     Pupils: Pupils are equal, round, and reactive to light.  Cardiovascular:     Rate and Rhythm: Normal rate and regular rhythm.     Pulses: Normal pulses.  Pulmonary:     Effort: Pulmonary effort is normal.     Breath sounds: Normal breath sounds.  Abdominal:     Palpations: Abdomen is soft.  Musculoskeletal:     Cervical back: Normal range of motion and neck supple.     Left hip: Tenderness present.  Neurological:     Mental Status: He is alert and oriented to person, place, and time.  Psychiatric:        Behavior: Behavior normal.      Assessment/Plan Left hip pain with a history of a left total hip arthroplasty with questionable infection versus trauma versus pseudotumor  My plan is to proceed to surgery for an open exploration and arthrotomy of the left hip.  We will assess the components and assess his fluid collection.  Fortunately he has not been on antibiotics and we will hold antibiotics until we obtain stat interoperative cultures and tissue samples for pathology to assess the number of white blood cells per high-power field.  He understands fully that we may need to perform a complete excision arthroplasty and placement of a temporary antibiotic spacer if infection is found.  If there is a pseudotumor and the components are stable, we would address the soft  tissue issues with a pseudotumor and hopefully retain the components except for exchange of hip ball and polyliner.  I had a long and thorough discussion about the risks of acute blood loss anemia, nerve and vessel injury, infection, DVT, fracture and implant failure.  His postoperative course will certainly depend on her interoperative findings.  Mcarthur Rossetti, MD 10/07/2020, 7:09 PM

## 2020-10-08 ENCOUNTER — Inpatient Hospital Stay (HOSPITAL_COMMUNITY): Payer: BC Managed Care – PPO | Admitting: Certified Registered Nurse Anesthetist

## 2020-10-08 ENCOUNTER — Inpatient Hospital Stay (HOSPITAL_COMMUNITY)
Admission: RE | Admit: 2020-10-08 | Discharge: 2020-10-12 | DRG: 468 | Disposition: A | Discharge status: Home Health | Payer: BC Managed Care – PPO | Attending: Orthopaedic Surgery | Admitting: Orthopaedic Surgery

## 2020-10-08 ENCOUNTER — Inpatient Hospital Stay (HOSPITAL_COMMUNITY): Payer: BC Managed Care – PPO

## 2020-10-08 ENCOUNTER — Encounter (HOSPITAL_COMMUNITY): Payer: Self-pay | Admitting: Orthopaedic Surgery

## 2020-10-08 ENCOUNTER — Other Ambulatory Visit: Payer: Self-pay

## 2020-10-08 ENCOUNTER — Encounter (HOSPITAL_COMMUNITY)
Admission: RE | Disposition: A | Discharge status: Home / Self Care | Payer: Self-pay | Source: Home / Self Care | Attending: Orthopaedic Surgery

## 2020-10-08 DIAGNOSIS — M25552 Pain in left hip: Secondary | ICD-10-CM | POA: Diagnosis not present

## 2020-10-08 DIAGNOSIS — Z96649 Presence of unspecified artificial hip joint: Secondary | ICD-10-CM

## 2020-10-08 DIAGNOSIS — Z720 Tobacco use: Secondary | ICD-10-CM

## 2020-10-08 DIAGNOSIS — Z20822 Contact with and (suspected) exposure to covid-19: Secondary | ICD-10-CM | POA: Diagnosis present

## 2020-10-08 DIAGNOSIS — Z6826 Body mass index (BMI) 26.0-26.9, adult: Secondary | ICD-10-CM

## 2020-10-08 DIAGNOSIS — B954 Other streptococcus as the cause of diseases classified elsewhere: Secondary | ICD-10-CM | POA: Diagnosis not present

## 2020-10-08 DIAGNOSIS — E669 Obesity, unspecified: Secondary | ICD-10-CM | POA: Diagnosis present

## 2020-10-08 DIAGNOSIS — T8452XA Infection and inflammatory reaction due to internal left hip prosthesis, initial encounter: Principal | ICD-10-CM | POA: Diagnosis present

## 2020-10-08 DIAGNOSIS — Y831 Surgical operation with implant of artificial internal device as the cause of abnormal reaction of the patient, or of later complication, without mention of misadventure at the time of the procedure: Secondary | ICD-10-CM | POA: Diagnosis present

## 2020-10-08 DIAGNOSIS — Z9889 Other specified postprocedural states: Secondary | ICD-10-CM | POA: Diagnosis not present

## 2020-10-08 DIAGNOSIS — Z8249 Family history of ischemic heart disease and other diseases of the circulatory system: Secondary | ICD-10-CM | POA: Diagnosis not present

## 2020-10-08 DIAGNOSIS — I10 Essential (primary) hypertension: Secondary | ICD-10-CM | POA: Diagnosis present

## 2020-10-08 DIAGNOSIS — Z96642 Presence of left artificial hip joint: Secondary | ICD-10-CM | POA: Diagnosis not present

## 2020-10-08 DIAGNOSIS — E781 Pure hyperglyceridemia: Secondary | ICD-10-CM | POA: Diagnosis present

## 2020-10-08 DIAGNOSIS — Z471 Aftercare following joint replacement surgery: Secondary | ICD-10-CM | POA: Diagnosis not present

## 2020-10-08 DIAGNOSIS — Z419 Encounter for procedure for purposes other than remedying health state, unspecified: Secondary | ICD-10-CM

## 2020-10-08 DIAGNOSIS — Z96643 Presence of artificial hip joint, bilateral: Secondary | ICD-10-CM | POA: Diagnosis not present

## 2020-10-08 DIAGNOSIS — Z96641 Presence of right artificial hip joint: Secondary | ICD-10-CM | POA: Diagnosis present

## 2020-10-08 DIAGNOSIS — T8484XA Pain due to internal orthopedic prosthetic devices, implants and grafts, initial encounter: Secondary | ICD-10-CM | POA: Diagnosis not present

## 2020-10-08 HISTORY — PX: TOTAL HIP REVISION: SHX763

## 2020-10-08 LAB — TYPE AND SCREEN
ABO/RH(D): O POS
Antibody Screen: NEGATIVE

## 2020-10-08 SURGERY — TOTAL HIP REVISION
Anesthesia: General | Site: Hip | Laterality: Left

## 2020-10-08 MED ORDER — HYDROMORPHONE HCL 1 MG/ML IJ SOLN
INTRAMUSCULAR | Status: AC
  Administered 2020-10-08: 0.5 mg
  Filled 2020-10-08: qty 1

## 2020-10-08 MED ORDER — CEFAZOLIN SODIUM-DEXTROSE 1-4 GM/50ML-% IV SOLN
1.0000 g | Freq: Four times a day (QID) | INTRAVENOUS | Status: AC
  Administered 2020-10-08 – 2020-10-09 (×2): 1 g via INTRAVENOUS
  Filled 2020-10-08 (×3): qty 50

## 2020-10-08 MED ORDER — METHOCARBAMOL 500 MG PO TABS
500.0000 mg | ORAL_TABLET | Freq: Four times a day (QID) | ORAL | Status: DC | PRN
  Administered 2020-10-08 – 2020-10-12 (×9): 500 mg via ORAL
  Filled 2020-10-08 (×9): qty 1

## 2020-10-08 MED ORDER — OXYCODONE HCL 5 MG PO TABS
10.0000 mg | ORAL_TABLET | ORAL | Status: DC | PRN
  Administered 2020-10-09: 15 mg via ORAL
  Administered 2020-10-09 – 2020-10-10 (×2): 10 mg via ORAL
  Filled 2020-10-08 (×2): qty 2
  Filled 2020-10-08: qty 3

## 2020-10-08 MED ORDER — POVIDONE-IODINE 10 % EX SWAB
2.0000 "application " | Freq: Once | CUTANEOUS | Status: AC
  Administered 2020-10-08: 2 via TOPICAL

## 2020-10-08 MED ORDER — LIDOCAINE HCL (PF) 2 % IJ SOLN
INTRAMUSCULAR | Status: AC
  Filled 2020-10-08: qty 5

## 2020-10-08 MED ORDER — FENTANYL CITRATE (PF) 250 MCG/5ML IJ SOLN
INTRAMUSCULAR | Status: AC
  Filled 2020-10-08: qty 5

## 2020-10-08 MED ORDER — DEXAMETHASONE SODIUM PHOSPHATE 10 MG/ML IJ SOLN
INTRAMUSCULAR | Status: AC
  Filled 2020-10-08: qty 1

## 2020-10-08 MED ORDER — DOCUSATE SODIUM 100 MG PO CAPS
100.0000 mg | ORAL_CAPSULE | Freq: Two times a day (BID) | ORAL | Status: DC
  Administered 2020-10-08 – 2020-10-12 (×8): 100 mg via ORAL
  Filled 2020-10-08 (×8): qty 1

## 2020-10-08 MED ORDER — LISINOPRIL 20 MG PO TABS
40.0000 mg | ORAL_TABLET | Freq: Every day | ORAL | Status: DC
  Administered 2020-10-09 – 2020-10-12 (×4): 40 mg via ORAL
  Filled 2020-10-08 (×4): qty 2

## 2020-10-08 MED ORDER — ROCURONIUM BROMIDE 10 MG/ML (PF) SYRINGE
PREFILLED_SYRINGE | INTRAVENOUS | Status: AC
  Filled 2020-10-08: qty 10

## 2020-10-08 MED ORDER — ONDANSETRON HCL 4 MG/2ML IJ SOLN
INTRAMUSCULAR | Status: AC
  Filled 2020-10-08: qty 2

## 2020-10-08 MED ORDER — VANCOMYCIN HCL 1000 MG IV SOLR
INTRAVENOUS | Status: DC | PRN
  Administered 2020-10-08: 1000 mg via TOPICAL

## 2020-10-08 MED ORDER — CHLORHEXIDINE GLUCONATE 0.12 % MT SOLN
15.0000 mL | Freq: Once | OROMUCOSAL | Status: AC
  Administered 2020-10-08: 15 mL via OROMUCOSAL

## 2020-10-08 MED ORDER — ACETAMINOPHEN 325 MG PO TABS: 325.0000 mg | ORAL_TABLET | Freq: Four times a day (QID) | ORAL | Status: DC | PRN

## 2020-10-08 MED ORDER — ONDANSETRON HCL 4 MG PO TABS: 4.0000 mg | ORAL_TABLET | Freq: Four times a day (QID) | ORAL | Status: DC | PRN

## 2020-10-08 MED ORDER — SODIUM CHLORIDE 0.9 % IV SOLN: INTRAVENOUS | Status: DC

## 2020-10-08 MED ORDER — METOCLOPRAMIDE HCL 5 MG/ML IJ SOLN: 5.0000 mg | Freq: Three times a day (TID) | INTRAMUSCULAR | Status: DC | PRN

## 2020-10-08 MED ORDER — FENTANYL CITRATE (PF) 100 MCG/2ML IJ SOLN
INTRAMUSCULAR | Status: AC
  Filled 2020-10-08: qty 2

## 2020-10-08 MED ORDER — ORAL CARE MOUTH RINSE: 15.0000 mL | Freq: Once | OROMUCOSAL | Status: AC

## 2020-10-08 MED ORDER — LIDOCAINE 2% (20 MG/ML) 5 ML SYRINGE
INTRAMUSCULAR | Status: DC | PRN
  Administered 2020-10-08: 60 mg via INTRAVENOUS

## 2020-10-08 MED ORDER — HYDROMORPHONE HCL 1 MG/ML IJ SOLN: 0.2500 mg | INTRAMUSCULAR | Status: DC | PRN

## 2020-10-08 MED ORDER — FENTANYL CITRATE (PF) 100 MCG/2ML IJ SOLN
25.0000 ug | INTRAMUSCULAR | Status: DC | PRN
  Administered 2020-10-08 (×2): 50 ug via INTRAVENOUS

## 2020-10-08 MED ORDER — ONDANSETRON HCL 4 MG/2ML IJ SOLN
INTRAMUSCULAR | Status: DC | PRN
  Administered 2020-10-08: 4 mg via INTRAVENOUS

## 2020-10-08 MED ORDER — SUGAMMADEX SODIUM 200 MG/2ML IV SOLN
INTRAVENOUS | Status: DC | PRN
  Administered 2020-10-08: 200 mg via INTRAVENOUS

## 2020-10-08 MED ORDER — SODIUM CHLORIDE 0.9 % IR SOLN
Status: DC | PRN
  Administered 2020-10-08: 1000 mL
  Administered 2020-10-08: 3000 mL

## 2020-10-08 MED ORDER — HYDROMORPHONE HCL 1 MG/ML IJ SOLN: 0.5000 mg | INTRAMUSCULAR | Status: DC | PRN

## 2020-10-08 MED ORDER — CEFAZOLIN SODIUM-DEXTROSE 2-4 GM/100ML-% IV SOLN
INTRAVENOUS | Status: AC
  Filled 2020-10-08: qty 100

## 2020-10-08 MED ORDER — METHOCARBAMOL 1000 MG/10ML IJ SOLN
500.0000 mg | Freq: Four times a day (QID) | INTRAVENOUS | Status: DC | PRN
  Filled 2020-10-08: qty 5

## 2020-10-08 MED ORDER — MIDAZOLAM HCL 5 MG/5ML IJ SOLN
INTRAMUSCULAR | Status: DC | PRN
  Administered 2020-10-08: 2 mg via INTRAVENOUS

## 2020-10-08 MED ORDER — DIPHENHYDRAMINE HCL 12.5 MG/5ML PO ELIX
12.5000 mg | ORAL_SOLUTION | ORAL | Status: DC | PRN
  Administered 2020-10-09 – 2020-10-11 (×3): 25 mg via ORAL
  Filled 2020-10-08 (×3): qty 10

## 2020-10-08 MED ORDER — ROCURONIUM BROMIDE 10 MG/ML (PF) SYRINGE
PREFILLED_SYRINGE | INTRAVENOUS | Status: DC | PRN
  Administered 2020-10-08: 10 mg via INTRAVENOUS
  Administered 2020-10-08: 20 mg via INTRAVENOUS
  Administered 2020-10-08: 60 mg via INTRAVENOUS

## 2020-10-08 MED ORDER — VANCOMYCIN HCL 1000 MG IV SOLR
INTRAVENOUS | Status: AC
  Filled 2020-10-08: qty 1000

## 2020-10-08 MED ORDER — MENTHOL 3 MG MT LOZG: 1.0000 | LOZENGE | OROMUCOSAL | Status: DC | PRN

## 2020-10-08 MED ORDER — ONDANSETRON HCL 4 MG/2ML IJ SOLN: 4.0000 mg | Freq: Once | INTRAMUSCULAR | Status: DC | PRN

## 2020-10-08 MED ORDER — ALUM & MAG HYDROXIDE-SIMETH 200-200-20 MG/5ML PO SUSP: 30.0000 mL | ORAL | Status: DC | PRN

## 2020-10-08 MED ORDER — PROPOFOL 10 MG/ML IV BOLUS
INTRAVENOUS | Status: AC
  Filled 2020-10-08: qty 20

## 2020-10-08 MED ORDER — TRANEXAMIC ACID-NACL 1000-0.7 MG/100ML-% IV SOLN
1000.0000 mg | INTRAVENOUS | Status: AC
  Administered 2020-10-08: 1000 mg via INTRAVENOUS
  Filled 2020-10-08: qty 100

## 2020-10-08 MED ORDER — 0.9 % SODIUM CHLORIDE (POUR BTL) OPTIME
TOPICAL | Status: DC | PRN
Start: 2020-10-08 — End: 2020-10-08
  Administered 2020-10-08: 1000 mL

## 2020-10-08 MED ORDER — CEFAZOLIN SODIUM-DEXTROSE 2-3 GM-%(50ML) IV SOLR
INTRAVENOUS | Status: DC | PRN
  Administered 2020-10-08: 2 g via INTRAVENOUS

## 2020-10-08 MED ORDER — ONDANSETRON HCL 4 MG/2ML IJ SOLN: 4.0000 mg | Freq: Four times a day (QID) | INTRAMUSCULAR | Status: DC | PRN

## 2020-10-08 MED ORDER — PROPOFOL 10 MG/ML IV BOLUS
INTRAVENOUS | Status: DC | PRN
  Administered 2020-10-08: 150 mg via INTRAVENOUS

## 2020-10-08 MED ORDER — EPHEDRINE 5 MG/ML INJ
INTRAVENOUS | Status: AC
  Filled 2020-10-08: qty 10

## 2020-10-08 MED ORDER — LACTATED RINGERS IV SOLN: INTRAVENOUS | Status: DC

## 2020-10-08 MED ORDER — AMISULPRIDE (ANTIEMETIC) 5 MG/2ML IV SOLN: 10.0000 mg | Freq: Once | INTRAVENOUS | Status: DC | PRN

## 2020-10-08 MED ORDER — POLYETHYLENE GLYCOL 3350 17 G PO PACK: 17.0000 g | PACK | Freq: Every day | ORAL | Status: DC | PRN

## 2020-10-08 MED ORDER — GABAPENTIN 100 MG PO CAPS
100.0000 mg | ORAL_CAPSULE | Freq: Three times a day (TID) | ORAL | Status: DC
  Administered 2020-10-08 – 2020-10-12 (×11): 100 mg via ORAL
  Filled 2020-10-08 (×11): qty 1

## 2020-10-08 MED ORDER — STERILE WATER FOR IRRIGATION IR SOLN
Status: DC | PRN
  Administered 2020-10-08: 2000 mL

## 2020-10-08 MED ORDER — FENTANYL CITRATE (PF) 250 MCG/5ML IJ SOLN
INTRAMUSCULAR | Status: DC | PRN
  Administered 2020-10-08 (×2): 50 ug via INTRAVENOUS
  Administered 2020-10-08: 100 ug via INTRAVENOUS
  Administered 2020-10-08 (×2): 50 ug via INTRAVENOUS

## 2020-10-08 MED ORDER — OXYCODONE HCL 5 MG/5ML PO SOLN: 5.0000 mg | Freq: Once | ORAL | Status: DC | PRN

## 2020-10-08 MED ORDER — MIDAZOLAM HCL 2 MG/2ML IJ SOLN
INTRAMUSCULAR | Status: AC
  Filled 2020-10-08: qty 2

## 2020-10-08 MED ORDER — PHENOL 1.4 % MT LIQD: 1.0000 | OROMUCOSAL | Status: DC | PRN

## 2020-10-08 MED ORDER — METOCLOPRAMIDE HCL 5 MG PO TABS: 5.0000 mg | ORAL_TABLET | Freq: Three times a day (TID) | ORAL | Status: DC | PRN

## 2020-10-08 MED ORDER — LACTATED RINGERS IV SOLN: INTRAVENOUS | Status: DC | PRN

## 2020-10-08 MED ORDER — OXYCODONE HCL 5 MG PO TABS
5.0000 mg | ORAL_TABLET | ORAL | Status: DC | PRN
  Administered 2020-10-09 (×2): 10 mg via ORAL
  Administered 2020-10-09: 5 mg via ORAL
  Administered 2020-10-10: 10 mg via ORAL
  Administered 2020-10-10: 5 mg via ORAL
  Administered 2020-10-10 (×2): 10 mg via ORAL
  Administered 2020-10-10: 5 mg via ORAL
  Administered 2020-10-11 – 2020-10-12 (×5): 10 mg via ORAL
  Filled 2020-10-08 (×12): qty 2

## 2020-10-08 MED ORDER — PANTOPRAZOLE SODIUM 40 MG PO TBEC
40.0000 mg | DELAYED_RELEASE_TABLET | Freq: Every day | ORAL | Status: DC
  Administered 2020-10-09 – 2020-10-12 (×4): 40 mg via ORAL
  Filled 2020-10-08 (×5): qty 1

## 2020-10-08 MED ORDER — ASPIRIN 81 MG PO CHEW
81.0000 mg | CHEWABLE_TABLET | Freq: Two times a day (BID) | ORAL | Status: DC
  Administered 2020-10-08 – 2020-10-12 (×8): 81 mg via ORAL
  Filled 2020-10-08 (×8): qty 1

## 2020-10-08 MED ORDER — DEXAMETHASONE SODIUM PHOSPHATE 10 MG/ML IJ SOLN
INTRAMUSCULAR | Status: DC | PRN
  Administered 2020-10-08: 10 mg via INTRAVENOUS

## 2020-10-08 MED ORDER — OXYCODONE HCL 5 MG PO TABS: 5.0000 mg | ORAL_TABLET | Freq: Once | ORAL | Status: DC | PRN

## 2020-10-08 SURGICAL SUPPLY — 60 items
BAG DECANTER FOR FLEXI CONT (MISCELLANEOUS) ×2 IMPLANT
BAG ZIPLOCK 12X15 (MISCELLANEOUS) ×2 IMPLANT
BLADE EXTENDED COATED 6.5IN (ELECTRODE) ×2 IMPLANT
BLADE SAW SAG 73X25 THK (BLADE)
BLADE SAW SGTL 73X25 THK (BLADE) IMPLANT
BLADE SURG SZ10 CARB STEEL (BLADE) ×2 IMPLANT
BRUSH FEMORAL CANAL (MISCELLANEOUS) ×2 IMPLANT
CNTNR URN SCR LID CUP LEK RST (MISCELLANEOUS) ×1 IMPLANT
CONT SPEC 4OZ STRL OR WHT (MISCELLANEOUS) ×2
COVER BACK TABLE 60X90IN (DRAPES) ×2 IMPLANT
COVER SURGICAL LIGHT HANDLE (MISCELLANEOUS) ×2 IMPLANT
COVER WAND RF STERILE (DRAPES) IMPLANT
DRAPE 3/4 80X56 (DRAPES) ×2 IMPLANT
DRAPE HIP W/POCKET STRL (MISCELLANEOUS) ×2 IMPLANT
DRAPE POUCH INSTRU U-SHP 10X18 (DRAPES) ×2 IMPLANT
DRAPE STERI IOBAN 125X83 (DRAPES) ×2 IMPLANT
DRAPE SURG 17X11 SM STRL (DRAPES) ×2 IMPLANT
DRAPE U-SHAPE 47X51 STRL (DRAPES) ×2 IMPLANT
DRESSING AQUACEL AG SP 3.5X10 (GAUZE/BANDAGES/DRESSINGS) ×1 IMPLANT
DRSG AQUACEL AG ADV 3.5X10 (GAUZE/BANDAGES/DRESSINGS) IMPLANT
DRSG AQUACEL AG ADV 3.5X14 (GAUZE/BANDAGES/DRESSINGS) IMPLANT
DRSG AQUACEL AG SP 3.5X10 (GAUZE/BANDAGES/DRESSINGS) ×2
DRSG PAD ABDOMINAL 8X10 ST (GAUZE/BANDAGES/DRESSINGS) IMPLANT
ELECT REM PT RETURN 15FT ADLT (MISCELLANEOUS) ×2 IMPLANT
EVACUATOR 1/8 PVC DRAIN (DRAIN) ×2 IMPLANT
FACESHIELD WRAPAROUND (MASK) ×8 IMPLANT
GAUZE SPONGE 4X4 12PLY STRL (GAUZE/BANDAGES/DRESSINGS) IMPLANT
GLOVE BIO SURGEON STRL SZ7.5 (GLOVE) ×2 IMPLANT
GLOVE BIOGEL PI IND STRL 8 (GLOVE) ×3 IMPLANT
GLOVE BIOGEL PI INDICATOR 8 (GLOVE) ×3
GLOVE ECLIPSE 8.0 STRL XLNG CF (GLOVE) ×4 IMPLANT
GOWN STRL REUS W/TWL XL LVL3 (GOWN DISPOSABLE) ×4 IMPLANT
HANDPIECE INTERPULSE COAX TIP (DISPOSABLE) ×2
HEAD FEM STD 32X+1 STRL (Hips) ×2 IMPLANT
KIT TURNOVER KIT A (KITS) IMPLANT
LINER ACET PNNCL PLUS4 NEUTRAL (Hips) ×1 IMPLANT
MANIFOLD NEPTUNE II (INSTRUMENTS) ×2 IMPLANT
MARKER SKIN DUAL TIP RULER LAB (MISCELLANEOUS) ×2 IMPLANT
PENCIL SMOKE EVACUATOR (MISCELLANEOUS) IMPLANT
PINNACLE PLUS 4 NEUTRAL (Hips) ×2 IMPLANT
PROTECTOR NERVE ULNAR (MISCELLANEOUS) ×2 IMPLANT
SET HNDPC FAN SPRY TIP SCT (DISPOSABLE) ×1 IMPLANT
SPONGE LAP 18X18 RF (DISPOSABLE) ×2 IMPLANT
STAPLER VISISTAT 35W (STAPLE) ×2 IMPLANT
STRIP CLOSURE SKIN 1/2X4 (GAUZE/BANDAGES/DRESSINGS) IMPLANT
SUT ETHIBOND NAB CT1 #1 30IN (SUTURE) ×6 IMPLANT
SUT MNCRL AB 4-0 PS2 18 (SUTURE) IMPLANT
SUT NYLON 3 0 (SUTURE) IMPLANT
SUT VIC AB 1 CT1 36 (SUTURE) ×4 IMPLANT
SUT VIC AB 2-0 CT1 27 (SUTURE) ×6
SUT VIC AB 2-0 CT1 TAPERPNT 27 (SUTURE) ×3 IMPLANT
SWAB COLLECTION DEVICE MRSA (MISCELLANEOUS) ×4 IMPLANT
SWAB CULTURE ESWAB REG 1ML (MISCELLANEOUS) ×4 IMPLANT
SYR 50ML LL SCALE MARK (SYRINGE) ×2 IMPLANT
TOWEL OR 17X26 10 PK STRL BLUE (TOWEL DISPOSABLE) ×4 IMPLANT
TOWEL OR NON WOVEN STRL DISP B (DISPOSABLE) ×2 IMPLANT
TRAY FOLEY MTR SLVR 16FR STAT (SET/KITS/TRAYS/PACK) ×2 IMPLANT
TUBE KAMVAC SUCTION (TUBING) IMPLANT
YANKAUER SUCT BULB TIP 10FT TU (MISCELLANEOUS) ×2 IMPLANT
YANKAUER SUCT BULB TIP NO VENT (SUCTIONS) ×2 IMPLANT

## 2020-10-08 NOTE — Anesthesia Preprocedure Evaluation (Signed)
Anesthesia Evaluation  Patient identified by MRN, date of birth, ID band Patient awake    Reviewed: Allergy & Precautions, NPO status , Patient's Chart, lab work & pertinent test results  History of Anesthesia Complications Negative for: history of anesthetic complications  Airway Mallampati: II  TM Distance: >3 FB Neck ROM: Full    Dental  (+) Teeth Intact   Pulmonary neg pulmonary ROS, former smoker,    Pulmonary exam normal        Cardiovascular hypertension, Normal cardiovascular exam     Neuro/Psych negative neurological ROS  negative psych ROS   GI/Hepatic negative GI ROS, Neg liver ROS,   Endo/Other  negative endocrine ROS  Renal/GU negative Renal ROS  negative genitourinary   Musculoskeletal  (+) Arthritis ,   Abdominal   Peds  Hematology negative hematology ROS (+)   Anesthesia Other Findings   Reproductive/Obstetrics                             Anesthesia Physical Anesthesia Plan  ASA: II  Anesthesia Plan: General   Post-op Pain Management:    Induction: Intravenous  PONV Risk Score and Plan: 2 and Ondansetron, Dexamethasone, Treatment may vary due to age or medical condition and Midazolam  Airway Management Planned: Oral ETT  Additional Equipment: None  Intra-op Plan:   Post-operative Plan: Extubation in OR  Informed Consent: I have reviewed the patients History and Physical, chart, labs and discussed the procedure including the risks, benefits and alternatives for the proposed anesthesia with the patient or authorized representative who has indicated his/her understanding and acceptance.     Dental advisory given  Plan Discussed with:   Anesthesia Plan Comments:         Anesthesia Quick Evaluation

## 2020-10-08 NOTE — Anesthesia Procedure Notes (Signed)
Procedure Name: Intubation Date/Time: 10/08/2020 2:17 PM Performed by: Mitzie Na, CRNA Pre-anesthesia Checklist: Patient identified, Emergency Drugs available, Suction available and Patient being monitored Patient Re-evaluated:Patient Re-evaluated prior to induction Oxygen Delivery Method: Circle system utilized Preoxygenation: Pre-oxygenation with 100% oxygen Induction Type: IV induction Ventilation: Mask ventilation without difficulty and Oral airway inserted - appropriate to patient size Laryngoscope Size: Mac and 3 Grade View: Grade I Tube type: Oral Tube size: 7.5 mm Number of attempts: 1 Airway Equipment and Method: Stylet and Oral airway Placement Confirmation: ETT inserted through vocal cords under direct vision,  positive ETCO2 and breath sounds checked- equal and bilateral Secured at: 23 cm Tube secured with: Tape Dental Injury: Teeth and Oropharynx as per pre-operative assessment

## 2020-10-08 NOTE — Plan of Care (Signed)
  Problem: Education: Goal: Knowledge of General Education information will improve Description: Including pain rating scale, medication(s)/side effects and non-pharmacologic comfort measures Outcome: Progressing   Problem: Health Behavior/Discharge Planning: Goal: Ability to manage health-related needs will improve Outcome: Progressing   Problem: Activity: Goal: Risk for activity intolerance will decrease Outcome: Progressing   Problem: Nutrition: Goal: Adequate nutrition will be maintained Outcome: Progressing   Problem: Elimination: Goal: Will not experience complications related to bowel motility Outcome: Progressing   Problem: Safety: Goal: Ability to remain free from injury will improve Outcome: Progressing   Problem: Skin Integrity: Goal: Risk for impaired skin integrity will decrease Outcome: Progressing   Problem: Education: Goal: Knowledge of the prescribed therapeutic regimen will improve Outcome: Progressing   Problem: Activity: Goal: Ability to avoid complications of mobility impairment will improve Outcome: Progressing   Problem: Pain Management: Goal: Pain level will decrease with appropriate interventions Outcome: Progressing

## 2020-10-08 NOTE — Transfer of Care (Signed)
Immediate Anesthesia Transfer of Care Note  Patient: Sergio Parker  Procedure(s) Performed: Procedure(s): IRRIGATION AND DEBRIDEMENT LEFT HIP, poly liner and hip ball exchange (Left)  Patient Location: PACU  Anesthesia Type:General  Level of Consciousness: Alert, Awake, Oriented  Airway & Oxygen Therapy: Patient Spontanous Breathing  Post-op Assessment: Report given to RN  Post vital signs: Reviewed and stable  Last Vitals:  Vitals:   10/08/20 1158  BP: (!) 152/95  Pulse: 68  Resp: 18  Temp: 37.1 C  SpO2: 13%    Complications: No apparent anesthesia complications

## 2020-10-08 NOTE — Interval H&P Note (Signed)
History and Physical Interval Note: Sergio Parker is well-known to me.  He understands that we are proceeding to surgery today to assess his left hip through an open arthrotomy to assess fluid collection and to better determine whether this is a chronic infection versus pseudotumor based on continued pain with his left hip and his elevated labs as well as recent MRI findings.  He still does not feel sick and has had no fever and chills.  His white blood cell count peripherally is normal.  His CRP and sed rate are still elevated.  We will proceed to surgery today and immediately sent off Gram stain and culture as well as look at the soft tissue and send that to pathology.  There has been no acute interval change in his medical status.  See recent H&P.  The risk and benefits of surgery have been explained in detail and informed consent is obtained.  The left hip is been marked.  10/08/2020 12:57 PM  Sergio Parker  has presented today for surgery, with the diagnosis of Left Total Hip Failure.  The various methods of treatment have been discussed with the patient and family. After consideration of risks, benefits and other options for treatment, the patient has consented to  Procedure(s): 1) EXCISION ARTHROPLASTY LEFT HIP,  2) IRRIGATION AND DEBRIDEMENT LEFT HIP, 3) REVISION LEFT HIP (Left) as a surgical intervention.  The patient's history has been reviewed, patient examined, no change in status, stable for surgery.  I have reviewed the patient's chart and labs.  Questions were answered to the patient's satisfaction.     Sergio Parker

## 2020-10-08 NOTE — OR Nursing (Signed)
Called Dr. Ninfa Linden to report lab results.  Patient going to his room.

## 2020-10-08 NOTE — Brief Op Note (Signed)
10/08/2020  4:34 PM  PATIENT:  Sergio Parker  54 y.o. male  PRE-OPERATIVE DIAGNOSIS:  Left Total Hip Failure  POST-OPERATIVE DIAGNOSIS:  Left Total Hip Failure  PROCEDURE:  Procedure(s): IRRIGATION AND DEBRIDEMENT LEFT HIP, poly liner and hip ball exchange (Left)  SURGEON:  Surgeon(s) and Role:    Mcarthur Rossetti, MD - Primary  PHYSICIAN ASSISTANT:  Benita Stabile, PA-C  ANESTHESIA:   general  EBL:  250 mL   COUNTS:  YES  DICTATION: .Other Dictation: Dictation Number [id number]  PATIENT DISPOSITION:  PACU - hemodynamically stable.   Delay start of Pharmacological VTE agent (>24hrs) due to surgical blood loss or risk of bleeding: yes

## 2020-10-09 LAB — CBC
HCT: 36.8 % — ABNORMAL LOW (ref 39.0–52.0)
Hemoglobin: 11.8 g/dL — ABNORMAL LOW (ref 13.0–17.0)
MCH: 27.3 pg (ref 26.0–34.0)
MCHC: 32.1 g/dL (ref 30.0–36.0)
MCV: 85.2 fL (ref 80.0–100.0)
Platelets: 322 10*3/uL (ref 150–400)
RBC: 4.32 MIL/uL (ref 4.22–5.81)
RDW: 13.9 % (ref 11.5–15.5)
WBC: 8.2 10*3/uL (ref 4.0–10.5)
nRBC: 0 % (ref 0.0–0.2)

## 2020-10-09 LAB — BASIC METABOLIC PANEL
Anion gap: 11 (ref 5–15)
BUN: 11 mg/dL (ref 6–20)
CO2: 25 mmol/L (ref 22–32)
Calcium: 8.8 mg/dL — ABNORMAL LOW (ref 8.9–10.3)
Chloride: 102 mmol/L (ref 98–111)
Creatinine, Ser: 0.85 mg/dL (ref 0.61–1.24)
GFR, Estimated: >60 mL/min (ref >60)
Glucose, Bld: 128 mg/dL — ABNORMAL HIGH (ref 70–99)
Potassium: 4.2 mmol/L (ref 3.5–5.1)
Sodium: 138 mmol/L (ref 135–145)

## 2020-10-09 MED ORDER — VANCOMYCIN HCL 1250 MG/250ML IV SOLN
1250.0000 mg | Freq: Two times a day (BID) | INTRAVENOUS | Status: DC
  Administered 2020-10-10 – 2020-10-11 (×3): 1250 mg via INTRAVENOUS
  Filled 2020-10-09 (×3): qty 250

## 2020-10-09 MED ORDER — VANCOMYCIN HCL 1500 MG/300ML IV SOLN
1500.0000 mg | Freq: Once | INTRAVENOUS | Status: AC
  Administered 2020-10-09: 1500 mg via INTRAVENOUS
  Filled 2020-10-09: qty 300

## 2020-10-09 NOTE — Progress Notes (Signed)
Patient ID: Sergio Parker, male   DOB: [birth year], 55 y.o.   MRN: [id number] [name] feels fine today.  His pain is minimal.  He has been up with therapy.  Interoperative findings yesterday showed no pseudotumor and no fluid collections.  There was some wear of this polyliner and I performed a hip ball and polyliner exchange of left hip.  However, there was some fluid behind the polyliner.  We sent off cultures from around the hip and behind the liner.  I did get a call from micro stating that they found in one of the Gram stains gram-positive cocci in chains.  The other Gram stain was negative for organisms.  The patient has never been on antibiotics.  An hour later I got a call from micro again stating that the same sample was read by micro over at [facility] and they stated that the techs over here were wrong and the second Gram stain actually shows no organisms either.  I talked this over with the patient and his wife.  I also talked in brief to one of the infectious disease specialist here just to get an opinion of what to do before I formally consult the ID team.  I will start him on vancomycin for the weekend.  I want to see what these cultures grow.  We will still likely recommend a PICC line and 6 weeks of IV antibiotics given the pain that he had been having in his hip and given his elevated CRP and sed rate.  His white blood cell count peripherally is always been normal.  The MRI findings that I actually saw myself in terms of the actual scans and the MRI report showed a lot of worrisome findings of fluid all around his left hip.  We did not experience this interoperative at all and the tissues all appeared normal.  The implants themselves showed no evidence of loosening at all.  We will continue to follow him closely.  He and his wife agree with this treatment plan.  His original hips were placed over 8 years ago.  He is very well-known to me.

## 2020-10-09 NOTE — TOC Initial Note (Signed)
Transition of Care [facility]) - Initial/Assessment Note    Patient Details  Name: Sergio Parker MRN: [id number] Date of Birth: 1966/09/26  Transition of Care (TOC) CM/SW Contact:    Joaquin Courts, RN Phone Number: 10/09/2020, 11:27 AM  Clinical Narrative:                 CM spoke with patient, [employer] to deliver rolling walker to bedside, patient declines 3in1.  [facility] to provide HHPT.  Expected Discharge Plan: [place] Barriers to Discharge: Continued Medical Work up   Patient Goals and CMS Choice Patient states their goals for this hospitalization and ongoing recovery are:: to go home with therapy CMS Medicare.gov Compare Post Acute Care list provided to:: Patient Choice offered to / list presented to : Patient  Expected Discharge Plan and Services Expected Discharge Plan: [place]   Discharge Planning Services: CM Consult Post Acute Care Choice: [place] arrangements for the past 2 months: Single Family Home                 DME Arranged: Walker rolling DME Agency: [employer] Date DME Agency Contacted: 10/09/20 Time DME Agency Contacted: [id number] Representative spoke with at DME Agency: [place]: PT [place]: [facility] [employer] [facility] [employer]     Representative spoke with at [place]: pre-arranged in md office  Prior Living Arrangements/Services Living arrangements for the past 2 months: [place]   Patient language and need for interpreter reviewed:: Yes Do you feel safe going back to the place where you live?: Yes      Need for Family Participation in Patient Care: Yes (Comment) Care giver support system in place?: Yes (comment)   Criminal Activity/Legal Involvement Pertinent to Current Situation/Hospitalization: No - Comment as needed  Activities of Daily Living Home Assistive Devices/Equipment: Eyeglasses ADL Screening (condition at time of admission) Patient's cognitive  ability adequate to safely complete daily activities?: Yes Is the patient deaf or have difficulty hearing?: Yes Does the patient have difficulty seeing, even when wearing glasses/contacts?: No Does the patient have difficulty concentrating, remembering, or making decisions?: No Patient able to express need for assistance with ADLs?: Yes Does the patient have difficulty dressing or bathing?: No Independently performs ADLs?: Yes (appropriate for developmental age) Does the patient have difficulty walking or climbing stairs?: No Weakness of Legs: None Weakness of Arms/Hands: None  Permission Sought/Granted                  Emotional Assessment Appearance:: Appears stated age Attitude/Demeanor/Rapport: Engaged Affect (typically observed): Accepting Orientation: : Oriented to Self,Oriented to Place,Oriented to  Time,Oriented to Situation   Psych Involvement: No (comment)  Admission diagnosis:  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 Status post revision of total hip 10/08/2020  . Pain in left hip 10/07/2020  . 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 Avascular necrosis of hip ([place]) 01/12/2012   PCP:  Haywood Pao, MD Pharmacy:   [id number] - [place], [place] [address] [place] [address] Phone: [phone] Fax: [phone]     Social Determinants of Health (SDOH) Interventions    Readmission Risk Interventions No flowsheet data found.

## 2020-10-09 NOTE — Progress Notes (Signed)
Pharmacy Antibiotic Note  Sergio Parker is a 55 y.o. 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  Pharmacy has been consulted for vancomycin dosing.  Plan: Vanc 1500mg  x 1, then 1250mg  q12h (AUC 559, Cmin 15.6, SCr 0.85, Vd 0.72) Goal AUC 400-550, plan to check levels at steady state Follow up renal function & cultures  Height: 5\' 7"  (170.2 cm) Weight: 77.1 kg (169 lb 15.6 oz) IBW/kg (Calculated) : 66.1  Temp (24hrs), Avg:98.4 F (36.9 C), Min:98 F (36.7 C), Max:98.9 F (37.2 C)  Recent Labs  Lab 10/05/20 1438 10/09/20 0243  WBC 4.5 8.2  CREATININE 0.76 0.85    Estimated Creatinine Clearance: 91.8 mL/min (by C-G formula based on SCr of 0.85 mg/dL).    No Known Allergies  Antimicrobials this admission:  12/17 Ancef x 2 12/17 Vanc >>  Dose adjustments this admission:   Microbiology results:  12/17 L hip fluid: no org seen 12/17 L hip fluid #2: no org seen (see Dr. Trevor Mace note)  Thank you for allowing pharmacy to be a part of this patient's care.  Peggyann Juba, PharmD, BCPS Pharmacy: [phone] 10/09/2020 11:58 AM

## 2020-10-09 NOTE — Op Note (Signed)
NAME: HICKS, FEICK MEDICAL RECORD [id number] ACCOUNT [id number] DATE OF BIRTH:16-Jan-1966 FACILITY: [facility] LOCATION: [facility] PHYSICIAN:Sherly Brodbeck Kerry [place], MD  OPERATIVE REPORT  DATE OF PROCEDURE:  10/08/2020  PREOPERATIVE DIAGNOSES:  Left hip pain with questionable left hip prosthetic joint infection versus pseudotumor versus inflammatory process.  POSTOPERATIVE DIAGNOSES:  Left hip pain with questionable left hip prosthetic joint infection versus pseudotumor versus inflammatory process.  PROCEDURE: 1.  Irrigation and debridement of left hip joint. 2.  Polyliner exchange and hip ball exchange, left hip joint.  IMPLANTS:  DePuy AltrX polyethylene liner, which was a 32+4 neutral liner for a size 50 acetabular component, size 32+1 metal hip ball.  INTRAOPERATIVE FINDINGS:  No worrisome findings of the soft tissues with minimal fluid collection and no gross purulence with Gram stain and cultures as well as soft tissue for inflammatory markers sent to pathology and pending.  SURGEON:  Lind Guest. Ninfa Linden, MD  ASSISTANT:  Erskine Emery, PA-C  ANESTHESIA:  General.  ANTIBIOTICS:  Two grams IV Ancef after cultures obtained.  ESTIMATED BLOOD LOSS:  300 mL.  COMPLICATIONS:  None.  INDICATIONS:  The patient is a 54 year old gentleman I actually have known for many years now.  We actually replaced both of his hips over eight and a half years ago.  He has never had any problems with his hips at all.  He then sustained a hard  mechanical fall with a twisting injury in late March of this year.  He went to the emergency room after feeling a pop in his hip and x-rays were obtained that did not show any worrisome features.  He continued to have some hip pain only with ambulation  and then had another hard mechanical fall accidentally in June on that hip.  He continued to have pain, but seemed to be getting better.  He would have more good days than bad days.  He does not have any  active medical problems and he is not  immunocompromised.  He is a thin individual and very healthy.  He denies any fever, chills, nausea, vomiting.  He has never been on antibiotics and never had any recent illnesses or sicknesses.  After continued hip pain that was still intermittent by  August, I obtained new x-rays, which were still normal.  I did order an aspiration of his hip and it was done under direct fluoroscopy and I watched this aspiration and there was minimal fluid from his hip and we sent that and it was negative for  organisms and minimal white blood cells.  I did send off a peripheral white blood cell count as well as a CRP and sed rate.  The white blood cell count was normal, but the CRP and sed rate were significantly elevated, so an MRI was obtained of his left  hip in August of this year.  There was abundant edema all around the left hip and acetabulum that was suggestive of a possible fracture around the acetabulum and torn muscles around the hip.  We felt that that may be the source of his pain since again he  does not have consistent pain and has never been ill.  We decided to follow this conservatively and he kept getting better, but still having some days if he was on his hip all day working that he would hurt at the end of day.  I saw him again in  November and decided we would repeat his labs and repeat his MRI.  His white blood cell  count remains normal and his sed rate and CRP are elevated, but the sed rate has come down significantly, but the CRP was still elevated.  I obtained a new MRI that  was worrisome for even worsening fluid collection around the hip and the radiologist who also saw the first MRI in comparison felt that this could be pseudotumor, but more likely some type of chronic infection.  At this point, I feel it is appropriate to  proceed with surgery.  I have talked to him in detail about this.  Today, even he is pain free and says he has not hurt with his hip in  about 3 or 4 days or more and it only hurts if he is very active.  Again, his exam is entirely benign and he denies  any fever, chills, nausea or vomiting.  His white blood cell count is again normal.  I talked to him about exploring the hip and assessing for pseudotumor infection and then addressing this accordingly intraoperative with say the worst case scenario and  excision arthroplasty and removing components and placing a temporary antibiotic spacer.  Given the MRI findings and his lab findings, we feel obligated to proceed at this point as they see.   DESCRIPTION OF PROCEDURE:  After informed consent was obtained and appropriate left hip was marked, he was brought to the operating room and general anesthesia was obtained while he was on a stretcher.  Foley catheter was placed and he was placed supine  on the operating table.  The perineal post in place and both legs in line skeletal traction device and no traction applied.  His left operative hip was prepped and draped with DuraPrep and sterile drapes.  A time-out was called.  He was identified as  correct patient, correct left hip.  We then went through his previous incision and dissected down the tensor fascia lata muscle.  We were actually able to divide the tensor fascia and get to the hip joint and soft tissue.  We did not experience any  abnormal appearing tissue as we were approaching the hip joint.  We were finally able to open up the hip joint and found minimal fluid.  This was not worrisome looking, but we did send it off for stat Gram stain and culture.  I then removed a lot of scar  tissue around the acetabular area and the hip joint capsule, removing the joint capsule and other tissue, which I sent to pathology for identification of inflammatory cells per high power field and for assessment of whether or not this was potentially  pseudotumor.  I was able to dislocate his hip and remove the hip ball.  This was a ceramic hip ball on a  metal trunnion from a [place] femoral component  There was definitely gray areas within the hip ball itself and wondered if this was some fretting  from ceramic ball on a metal trunnion.  The femoral component with not loose at all.  We tried multiple attempts to see if it was loosened and it was not loose.  I then was able to easily remove the previous polyethylene liner.  It did show poly wear,  but nothing that looks severe.  There was no fracture of the poly or of the ceramic hip.  There was some questionable tissue or questionable fluid behind the polyethylene liner, so we sent this off for stat Gram stain and cultures.  At that point, we  decided to go ahead and give  him 2 grams of IV Ancef.  I then removed the apex hole eliminator guide.  I used a probe behind this and I only found solid bone and that was the same with the 3 screw holes in the acetabulum.  I then was able to screw in the  cup inserter.  There was a long handle and to see if the acetabular component was loose in any way and it was rock solid stable and not loose.  We then irrigated the soft tissue with 3 liters normal saline solution using pulsatile lavage.  I assessed  the tissue further probing it around every aspect of I could, even placing a bent Hohmann over the medial acetabular rim and to see if this would elicit any fluid releasing and it did not.  I probed every single place around that acetabulum component hip  that I could and found no worrisome features.  I decided to then place some vancomycin powder within the acetabulum and I placed the new polyethylene liner.  I placed a new 32+1 metal hip ball and reduced this in the acetabulum and it was stable  assessing this radiographically and mechanically.  We then irrigated the soft tissue again with normal saline solution and once we dried it real well, we placed vancomycin powder deep in the arthrotomy area.  There was no joint capsule left to close.  We  then reapproximated  the tensor fascia with #1 Vicryl followed by 0 Vicryl in the deep tissue and 2-0 Vicryl to close the subcutaneous tissue.  The skin was reapproximated with staples.  He was awakened, extubated, and taken to recovery room in stable  condition with all final counts being correct.  No complications noted.  Postoperatively, we will follow these cultures and inflammatory markers and make a decision of whether or not to obtain an infectious disease consult and still put him on IV  antibiotics.  I was not convinced right now that we needed to perform an excision arthroplasty.  I may even send him to the [facility] that is for infected prosthesis in [place] that Dr. Richardo Priest runs to get their opinion if this ends up  showing some type of infection.  I did feel comfortable with what we did in the surgery today.  Our sharp excision was of again just the capsular tissue around the hip.  Of note, Benita Stabile, PA-C, assisted during the entire case and assistance was  crucial for facilitating all aspects of this case.  IN/NUANCE  D:10/08/2020 T:10/09/2020 JOB:[id number]

## 2020-10-09 NOTE — Plan of Care (Signed)
  Problem: Education: Goal: Knowledge of General Education information will improve Description: Including pain rating scale, medication(s)/side effects and non-pharmacologic comfort measures Outcome: Progressing   Problem: Clinical Measurements: Goal: Ability to maintain clinical measurements within normal limits will improve Outcome: Progressing   Problem: Clinical Measurements: Goal: Diagnostic test results will improve Outcome: Progressing   Problem: Nutrition: Goal: Adequate nutrition will be maintained Outcome: Progressing   

## 2020-10-09 NOTE — Evaluation (Signed)
Physical Therapy Evaluation Patient Details Name: Sergio Parker MRN: [id number] DOB: 05/16/1966 Today's Date: 10/09/2020   History of Present Illness  54 yo male s/p L hip I&D, poly exchange 10/08/20. 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 He walked ~150 feet with a RW. Moderate pain with activity. Anticipate pt will progress well. Unsure of d/c plan-awaiting MD rounding. Pt could d/c home later today if medically cleared by MD.     Follow Up Recommendations Follow surgeon's recommendation for DC plan and follow-up therapies (plan is for HHPT?)    Equipment Recommendations  Rolling walker with 5" wheels    Recommendations for Other Services       Precautions / Restrictions Precautions Precautions: Fall Restrictions Weight Bearing Restrictions: No Other Position/Activity Restrictions: WBAT      Mobility  Bed Mobility Overal bed mobility: Needs Assistance Bed Mobility: Supine to Sit;Sit to Supine     Supine to sit: Min guard;HOB elevated Sit to supine: Min guard;HOB elevated        Transfers Overall transfer level: Needs assistance Equipment used: Rolling walker (2 wheeled) Transfers: Sit to/from Stand Sit to Stand: Min guard         General transfer comment: Cues for safety, hand placement.  Ambulation/Gait Ambulation/Gait assistance: Min guard Gait Distance (Feet): 150 Feet Assistive device: Rolling walker (2 wheeled) Gait Pattern/deviations: Step-to pattern;Decreased stride length;Step-through pattern     General Gait Details: Min guard for safety.  Stairs            Wheelchair Mobility    Modified Rankin (Stroke Patients Only)       Balance Overall balance assessment: Needs assistance         Standing balance support: Bilateral upper extremity supported Standing balance-Leahy Scale: Fair                               Pertinent  Vitals/Pain Pain Assessment: 0-10 Pain Score: 5  Pain Location: L hip, buttocks, groin Pain Descriptors / Indicators: Discomfort;Sore;Tightness;Aching Pain Intervention(s): Limited activity within patient's tolerance;Monitored during session;Repositioned;Ice applied    Home Living Family/patient expects to be discharged to:: Private residence Living Arrangements: Spouse/significant other;Children   Type of Home: House Home Access: Stairs to enter   [employer] of Steps: 1 Home Layout: Two level;Able to live on main level with bedroom/bathroom Home Equipment: Kasandra Knudsen - single point;Crutches      Prior Function Level of Independence: Independent               Hand Dominance        Extremity/Trunk Assessment   Upper Extremity Assessment Upper Extremity Assessment: Overall WFL for tasks assessed    Lower Extremity Assessment Lower Extremity Assessment: Generalized weakness    Cervical / Trunk Assessment Cervical / Trunk Assessment: Normal  Communication   Communication: No difficulties  Cognition Arousal/Alertness: Awake/alert Behavior During Therapy: WFL for tasks assessed/performed Overall Cognitive Status: Within Functional Limits for tasks assessed                                        General Comments      Exercises General Exercises - Lower Extremity Long Arc Quad: AROM;Left;10 reps;Seated Heel Slides: AAROM;Left;10 reps;Supine   Assessment/Plan    PT Assessment  Patient needs continued PT services  PT Problem List Decreased strength;Decreased mobility;Decreased range of motion;Decreased activity tolerance;Decreased balance;Decreased knowledge of use of DME;Pain       PT Treatment Interventions DME instruction;Gait training;Therapeutic activities;Patient/family education;Therapeutic exercise;Balance training;Functional mobility training    PT Goals (Current goals can be found in the Care Plan section)  Acute Rehab PT  Goals Patient Stated Goal: to get better. PT Goal Formulation: With patient Time For Goal Achievement: 10/23/20 Potential to Achieve Goals: Good    Frequency 7X/week   Barriers to discharge        Co-evaluation               AM-PAC PT "6 Clicks" Mobility  Outcome Measure Help needed turning from your back to your side while in a flat bed without using bedrails?: A Little Help needed moving from lying on your back to sitting on the side of a flat bed without using bedrails?: A Little Help needed moving to and from a bed to a chair (including a wheelchair)?: A Little Help needed standing up from a chair using your arms (e.g., wheelchair or bedside chair)?: A Little Help needed to walk in hospital room?: A Little Help needed climbing 3-5 steps with a railing? : A Little 6 Click Score: 18    End of Session Equipment Utilized During Treatment: Gait belt Activity Tolerance: Patient tolerated treatment well Patient left: in bed;with call bell/phone within reach   PT Visit Diagnosis: Other abnormalities of gait and mobility (R26.89)    Time: 0930-1000 PT Time Calculation (min) (ACUTE ONLY): 30 min   Charges:   PT Evaluation $PT Eval Low Complexity: 1 Low PT Treatments $Gait Training: 8-22 mins          Doreatha Massed, PT Acute Rehabilitation  Office: [phone] Pager: [phone]

## 2020-10-10 NOTE — Progress Notes (Signed)
   Subjective: 2 Days Post-Op Procedure(s) (LRB): IRRIGATION AND DEBRIDEMENT LEFT HIP, poly liner and hip ball exchange (Left) Patient reports pain as mild and moderate.    Objective: Vital signs in last 24 hours: Temp:  [98.1 F (36.7 C)-98.2 F (36.8 C)] 98.1 F (36.7 C) (12/19 0606) Pulse Rate:  [52-71] 52 (12/19 0606) Resp:  [15-16] 15 (12/19 0606) BP: (104-116)/(60-76) 108/70 (12/19 0606) SpO2:  [95 %-100 %] 100 % (12/19 0606)  Intake/Output from previous day: 12/18 0701 - 12/19 0700 In: 1728.8 [P.O.:840; I.V.:588.8; IV Piggyback:300] Out: 950 [Urine:950] Intake/Output this shift: No intake/output data recorded.  Recent Labs    10/09/20 0243  HGB 11.8*   Recent Labs    10/09/20 0243  WBC 8.2  RBC 4.32  HCT 36.8*  PLT 322   Recent Labs    10/09/20 0243  NA 138  K 4.2  CL 102  CO2 25  BUN 11  CREATININE 0.85  GLUCOSE 128*  CALCIUM 8.8*   No results for input(s): LABPT, INR in the last 72 hours.  Neurologically intact No results found.  Assessment/Plan: 2 Days Post-Op Procedure(s) (LRB): IRRIGATION AND DEBRIDEMENT LEFT HIP, poly liner and hip ball exchange (Left) Up with therapy, on Vanc  24 hr cultures neg at this point. Continue ABX and await cultures.   Sergio Parker 10/10/2020, 9:02 AM

## 2020-10-10 NOTE — Progress Notes (Signed)
Physical Therapy Treatment Patient Details Name: Sergio Parker MRN: [id number] DOB: 01/31/1966 Today's Date: 10/10/2020    History of Present Illness 54 yo male s/p L hip I&D, poly exchange 10/08/20. 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 Will continue to follow. Moderate pain with activity.    Follow Up Recommendations  Follow surgeon's recommendation for DC plan and follow-up therapies     Equipment Recommendations  Rolling walker with 5" wheels    Recommendations for Other Services       Precautions / Restrictions Precautions Precautions: Fall Restrictions Weight Bearing Restrictions: No Other Position/Activity Restrictions: WBAT    Mobility  Bed Mobility Overal bed mobility: Needs Assistance Bed Mobility: Supine to Sit;Sit to Supine     Supine to sit: HOB elevated;Supervision Sit to supine: HOB elevated;Supervision   General bed mobility comments: Increased time.  Transfers Overall transfer level: Needs assistance Equipment used: Rolling walker (2 wheeled) Transfers: Sit to/from Stand Sit to Stand: Supervision         General transfer comment: Cues for safety, hand placement.  Ambulation/Gait Ambulation/Gait assistance: Supervision Gait Distance (Feet): 175 Feet Assistive device: Rolling walker (2 wheeled) Gait Pattern/deviations: Step-through pattern;Decreased stride length     General Gait Details: Supv for safety. no LOB with RW use. cues for proper use of RW and to increased WBing as tolerated.   Stairs             Wheelchair Mobility    Modified Rankin (Stroke Patients Only)       Balance Overall balance assessment: Needs assistance         Standing balance support: Bilateral upper extremity supported Standing balance-Leahy Scale: Fair                              Cognition Arousal/Alertness: Awake/alert Behavior During Therapy: WFL for tasks  assessed/performed Overall Cognitive Status: Within Functional Limits for tasks assessed                                        Exercises General Exercises - Lower Extremity Long Arc Quad: AROM;Left;15 reps;Seated Heel Slides: AROM;Left;15 reps;Supine Hip ABduction/ADduction: AROM;Left;10 reps;Supine    General Comments        Pertinent Vitals/Pain Pain Assessment: 0-10 Pain Score: 5  Pain Location: L hip, thigh Pain Descriptors / Indicators: Discomfort;Sore;Tightness;Aching Pain Intervention(s): Monitored during session;Repositioned;Ice applied    Home Living                      Prior Function            PT Goals (current goals can now be found in the care plan section) Progress towards PT goals: Progressing toward goals    Frequency    7X/week      PT Plan Current plan remains appropriate    Co-evaluation              AM-PAC PT "6 Clicks" Mobility   Outcome Measure  Help needed turning from your back to your side while in a flat bed without using bedrails?: A Little Help needed moving from lying on your back to sitting on the side of a flat bed without using bedrails?: A Little Help needed moving to and from a bed  to a chair (including a wheelchair)?: A Little Help needed standing up from a chair using your arms (e.g., wheelchair or bedside chair)?: A Little Help needed to walk in hospital room?: A Little Help needed climbing 3-5 steps with a railing? : A Little 6 Click Score: 18    End of Session Equipment Utilized During Treatment: Gait belt Activity Tolerance: Patient tolerated treatment well Patient left: in bed;with call bell/phone within reach   PT Visit Diagnosis: Other abnormalities of gait and mobility (R26.89)     Time: 3833-3832 PT Time Calculation (min) (ACUTE ONLY): 16 min  Charges:  $Gait Training: 8-22 mins                        Doreatha Massed, PT Acute Rehabilitation  Office: [phone] Pager:  [phone]

## 2020-10-10 NOTE — Plan of Care (Signed)
Plan of care reviewed and discussed with the patient. 

## 2020-10-10 NOTE — Progress Notes (Addendum)
Culture came back 30 minutes ago ,was notified by pharmacy,  positive for strept. MIC not out yet. Will leave on VANC until MIC out then likely better choice for IV ABX therapy. Other culture was neg. Dr. Ninfa Linden can make change Monday if needed. Post Hx years ago PCR positive for MSSA and MRSA but neg on repeats.

## 2020-10-10 NOTE — Anesthesia Postprocedure Evaluation (Signed)
Anesthesia Post Note  Patient: Sergio Parker  Procedure(s) Performed: IRRIGATION AND DEBRIDEMENT LEFT HIP, poly liner and hip ball exchange (Left Hip)     Patient location during evaluation: PACU Anesthesia Type: General Level of consciousness: awake and alert Pain management: pain level controlled Vital Signs Assessment: post-procedure vital signs reviewed and stable Respiratory status: spontaneous breathing, nonlabored ventilation, respiratory function stable and patient connected to nasal cannula oxygen Cardiovascular status: blood pressure returned to baseline and stable Postop Assessment: no apparent nausea or vomiting Anesthetic complications: no   No complications documented.  Last Vitals:  Vitals:   10/09/20 2056 10/10/20 0606  BP: 111/60 108/70  Pulse: 60 (!) 52  Resp: 16 15  Temp: 36.8 C 36.7 C  SpO2: 99% 100%    Last Pain:  Vitals:   10/10/20 0655  TempSrc:   PainSc: 0-No pain                 Lidia Collum

## 2020-10-11 ENCOUNTER — Encounter (HOSPITAL_COMMUNITY): Payer: Self-pay | Admitting: Orthopaedic Surgery

## 2020-10-11 ENCOUNTER — Inpatient Hospital Stay: Payer: Self-pay

## 2020-10-11 DIAGNOSIS — B954 Other streptococcus as the cause of diseases classified elsewhere: Secondary | ICD-10-CM

## 2020-10-11 MED ORDER — CHLORHEXIDINE GLUCONATE CLOTH 2 % EX PADS
6.0000 | MEDICATED_PAD | Freq: Every day | CUTANEOUS | Status: DC
  Administered 2020-10-12: 6 via TOPICAL

## 2020-10-11 MED ORDER — VANCOMYCIN HCL 1250 MG/250ML IV SOLN
1250.0000 mg | Freq: Two times a day (BID) | INTRAVENOUS | Status: DC
  Administered 2020-10-11 – 2020-10-12 (×2): 1250 mg via INTRAVENOUS
  Filled 2020-10-11 (×2): qty 250

## 2020-10-11 MED ORDER — SODIUM CHLORIDE 0.9 % IV SOLN
2.0000 g | INTRAVENOUS | Status: DC
  Filled 2020-10-11: qty 20

## 2020-10-11 MED ORDER — SODIUM CHLORIDE 0.9% FLUSH: 10.0000 mL | INTRAVENOUS | Status: DC | PRN

## 2020-10-11 NOTE — Progress Notes (Signed)
Physical Therapy Treatment Patient Details Name: Sergio Parker MRN: [id number] DOB: 04/23/1966 Today's Date: 10/11/2020    History of Present Illness 54 yo male s/p L hip I&D, poly exchange 10/08/20. 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    Follow Up Recommendations  Follow surgeon's recommendation for DC plan and follow-up therapies     Equipment Recommendations  Rolling walker with 5" wheels    Recommendations for Other Services       Precautions / Restrictions Precautions Precautions: Fall Restrictions Weight Bearing Restrictions: No Other Position/Activity Restrictions: WBAT    Mobility  Bed Mobility Overal bed mobility: Modified Independent Bed Mobility: Supine to Sit;Sit to Supine           General bed mobility comments: Increased time.  Transfers Overall transfer level: Needs assistance Equipment used: Rolling walker (2 wheeled) Transfers: Sit to/from Stand Sit to Stand: Supervision         General transfer comment: Cues for safety, hand placement.  Ambulation/Gait Ambulation/Gait assistance: Supervision Gait Distance (Feet): 200 Feet Assistive device: Rolling walker (2 wheeled) Gait Pattern/deviations: Step-through pattern;Decreased stride length     General Gait Details: Supv for safety. no LOB with RW use. cues for proper use of RW and to increased WBing as tolerated.   Stairs             Wheelchair Mobility    Modified Rankin (Stroke Patients Only)       Balance Overall balance assessment: Needs assistance         Standing balance support: Bilateral upper extremity supported Standing balance-Leahy Scale: Fair                              Cognition Arousal/Alertness: Awake/alert Behavior During Therapy: WFL for tasks assessed/performed Overall Cognitive Status: Within Functional Limits for tasks assessed                                         Exercises Total Joint Exercises Hip ABduction/ADduction: AROM;Left;10 reps;Standing Knee Flexion: AROM;Left;10 reps;Standing Marching in Standing: AROM;Both;10 reps;Standing General Exercises - Lower Extremity Heel Raises: AROM;10 reps;Both;Standing    General Comments        Pertinent Vitals/Pain Pain Assessment: 0-10 Pain Score: 5  Pain Location: L hip, thigh Pain Descriptors / Indicators: Discomfort;Sore;Tightness;Aching Pain Intervention(s): Limited activity within patient's tolerance;Monitored during session    Home Living                      Prior Function            PT Goals (current goals can now be found in the care plan section) Progress towards PT goals: Progressing toward goals    Frequency    7X/week      PT Plan Current plan remains appropriate    Co-evaluation              AM-PAC PT "6 Clicks" Mobility   Outcome Measure  Help needed turning from your back to your side while in a flat bed without using bedrails?: None Help needed moving from lying on your back to sitting on the side of a flat bed without using bedrails?: None Help needed moving to and from a bed to a chair (including a wheelchair)?: A  Little Help needed standing up from a chair using your arms (e.g., wheelchair or bedside chair)?: A Little Help needed to walk in hospital room?: A Little Help needed climbing 3-5 steps with a railing? : A Little 6 Click Score: 20    End of Session Equipment Utilized During Treatment: Gait belt Activity Tolerance: Patient tolerated treatment well Patient left: in chair;with call bell/phone within reach   PT Visit Diagnosis: Other abnormalities of gait and mobility (R26.89)     Time: 5573-2202 PT Time Calculation (min) (ACUTE ONLY): 19 min  Charges:  $Gait Training: 8-22 mins                        Doreatha Massed, PT Acute Rehabilitation  Office: [phone] Pager: [phone]

## 2020-10-11 NOTE — TOC Progression Note (Signed)
Transition of Care [facility]) - Progression Note    Patient Details  Name: NAZIAH WECKERLY MRN: [id number] Date of Birth: 09-13-1966  [facility] CM/SW Contact  Lennart Pall, LCSW Phone Number: 10/11/2020, 10:01 AM  Clinical Narrative:    Have spoken with [facility] Carolynn Sayers) to alert to plan for home IV abx needs.  Await further information on confirmed abx and confirmation that [facility] can add nursing service to plan.   Expected Discharge Plan: [place] Barriers to Discharge: Continued Medical Work up  Expected Discharge Plan and Services Expected Discharge Plan: [place]   Discharge Planning Services: CM Consult Post Acute Care Choice: [place] arrangements for the past 2 months: Single Family Home                 DME Arranged: Walker rolling DME Agency: [employer] Date DME Agency Contacted: 10/09/20 Time DME Agency Contacted: [id number] Representative spoke with at DME Agency: [place]: PT [place]: [facility] [employer] [facility] [employer]     Representative spoke with at [place]: pre-arranged in md office   Social Determinants of Health ([place]) Interventions    Readmission Risk Interventions No flowsheet data found.

## 2020-10-11 NOTE — Progress Notes (Signed)
Okay to place PICC per Dr. Baxter Flattery, Infectious Disease.

## 2020-10-11 NOTE — Progress Notes (Signed)
Patient ID: Sergio Parker, male   DOB: [birth year], 54 y.o.   MRN: [id number] The patient understands that our plan is to have a PICC line placed today for IV antibiotics.  I will also consult the infectious disease service for their input as to type of antibiotic and duration.  Hopefully, we can then discharged him to home tomorrow once this has been set up.

## 2020-10-11 NOTE — Progress Notes (Signed)
Peripherally Inserted Central Catheter Placement  The IV Nurse has discussed with the patient and/or persons authorized to consent for the patient, the purpose of this procedure and the potential benefits and risks involved with this procedure.  The benefits include less needle sticks, lab draws from the catheter, and the patient may be discharged home with the catheter. Risks include, but not limited to, infection, bleeding, blood clot (thrombus formation), and puncture of an artery; nerve damage and irregular heartbeat and possibility to perform a PICC exchange if needed/ordered by physician.  Alternatives to this procedure were also discussed.  Bard Power PICC patient education guide, fact sheet on infection prevention and patient information card has been provided to patient /or left at bedside.    PICC Placement Documentation  PICC Single Lumen 80/32/12 PICC Right Basilic 41 cm 0 cm (Active)  Indication for Insertion or Continuance of Line Prolonged intravenous therapies;Home intravenous therapies (PICC only) 10/11/20 1306  Exposed Catheter (cm) 0 cm 10/11/20 1306  Site Assessment Clean;Dry;Intact 10/11/20 1306  Line Status Flushed;Saline locked;Blood return noted 10/11/20 1306  Dressing Type Transparent 10/11/20 1306  Dressing Status Clean;Dry;Intact 10/11/20 1306  Antimicrobial disc in place? Yes 10/11/20 1306  Safety Lock Not Applicable 24/82/50 0370  Line Care Connections checked and tightened 10/11/20 1306  Dressing Intervention New dressing 10/11/20 1306  Dressing Change Due 10/18/20 10/11/20 [place], Nicolette Bang 10/11/2020, 1:08 PM

## 2020-10-12 LAB — SEDIMENTATION RATE: Sed Rate: 31 mm/hr — ABNORMAL HIGH (ref 0–16)

## 2020-10-12 LAB — CK: Total CK: 354 U/L (ref 49–397)

## 2020-10-12 LAB — C-REACTIVE PROTEIN: CRP: 1.7 mg/dL — ABNORMAL HIGH (ref <1.0)

## 2020-10-12 LAB — VANCOMYCIN, TROUGH: Vancomycin Tr: 14 ug/mL — ABNORMAL LOW (ref 15–20)

## 2020-10-12 MED ORDER — HEPARIN SOD (PORK) LOCK FLUSH 100 UNIT/ML IV SOLN
250.0000 [IU] | INTRAVENOUS | Status: AC | PRN
  Administered 2020-10-12: 250 [IU]
  Filled 2020-10-12: qty 2.5

## 2020-10-12 MED ORDER — METHOCARBAMOL 500 MG PO TABS: 500.0000 mg | ORAL_TABLET | Freq: Four times a day (QID) | ORAL | 1 refills | Status: DC | PRN

## 2020-10-12 MED ORDER — ASPIRIN 81 MG PO CHEW: 81.0000 mg | CHEWABLE_TABLET | Freq: Two times a day (BID) | ORAL | 0 refills | Status: DC

## 2020-10-12 MED ORDER — SODIUM CHLORIDE 0.9 % IV SOLN: 620.0000 mg | Freq: Every day | INTRAVENOUS | 0 refills | Status: DC

## 2020-10-12 MED ORDER — DAPTOMYCIN IV (FOR PTA / DISCHARGE USE ONLY): 620.0000 mg | INTRAVENOUS | 0 refills | Status: AC

## 2020-10-12 MED ORDER — OXYCODONE HCL 5 MG PO TABS: 5.0000 mg | ORAL_TABLET | ORAL | 0 refills | Status: DC | PRN

## 2020-10-12 MED ORDER — SODIUM CHLORIDE 0.9 % IV SOLN
620.0000 mg | Freq: Every day | INTRAVENOUS | Status: DC
  Administered 2020-10-12: 620 mg via INTRAVENOUS
  Filled 2020-10-12: qty 12.4

## 2020-10-12 NOTE — TOC Transition Note (Signed)
Transition of Care [facility]) - CM/SW Discharge Note   Patient Details  Name: Sergio Parker MRN: [id number] Date of Birth: July 25, 1966  [facility] Care [facility]) CM/SW Contact:  Lennart Pall, LCSW Phone Number: 10/12/2020, 10:36 AM   Clinical Narrative:    Pt to be discharged home today following abx dosing and teacing.  DME ordered.  HHRN/PT to be provided via [facility].  IV abx to be provided by [facility].  No further TOC needs.   Final next level of care: [place] Barriers to Discharge: Barriers Resolved   Patient Goals and CMS Choice Patient states their goals for this hospitalization and ongoing recovery are:: to go home with therapy CMS Medicare.gov Compare Post Acute Care list provided to:: Patient Choice offered to / list presented to : Patient  Discharge Placement                       Discharge Plan and Services   Discharge Planning Services: CM Consult Post Acute Care Choice: Home Health          DME Arranged: Walker rolling DME Agency: [employer] Date DME Agency Contacted: 10/09/20 Time DME Agency Contacted: [phone] Representative spoke with at DME Agency: [place]: RN,PT [place] Agency: [facility] [employer] [facility] [employer]     Representative spoke with at [place]: pre-arranged in md office  Social Determinants of Health ([place]) Interventions     Readmission Risk Interventions No flowsheet data found.

## 2020-10-12 NOTE — Discharge Instructions (Signed)

## 2020-10-12 NOTE — Plan of Care (Signed)
All discharge instructions were given to the Pt. All questions were answered. Discussed pain management. 

## 2020-10-12 NOTE — Discharge Summary (Addendum)
Patient ID: Sergio Parker MRN: [id number] DOB/AGE: 1966/06/12 54 y.o.  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 Arthritis   . Avascular necrosis (HCC)   . ED (erectile dysfunction)   . Hypertension    PCP Dr Wylene Simmer  . Hypertriglyceridemia   . Microalbuminuria   . Obesity     Surgeries: Procedure(s): IRRIGATION AND DEBRIDEMENT LEFT HIP, poly liner and hip ball exchange on 10/08/2020   Consultants:   Discharged Condition: Improved  Hospital Course: Sergio Parker is an 54 y.o. male who was admitted 10/08/2020 for operative treatment ofPain in left hip. Patient has severe unremitting pain that affects sleep, daily activities, and work/hobbies. After pre-op clearance the patient was taken to the operating room on 10/08/2020 and underwent  Procedure(s): IRRIGATION AND DEBRIDEMENT LEFT HIP, poly liner and hip ball exchange.    Patient was given perioperative antibiotics:  Anti-infectives (From admission, onward)   Start     Dose/Rate Route Frequency Ordered Stop   10/13/20 0000  DAPTOmycin 620 mg in sodium chloride 0.9 % 100 mL  Status:  Discontinued        620 mg Intravenous Daily 10/12/20 1424 10/12/20    10/12/20 1000  DAPTOmycin (CUBICIN) 620 mg in sodium chloride 0.9 % IVPB        620 mg 224.8 mL/hr over 30 Minutes Intravenous Daily 10/12/20 0805     10/12/20 0000  daptomycin (CUBICIN) IVPB        620 mg Intravenous Every 24 hours 10/12/20 1424 11/20/20 2359   10/11/20 1800  cefTRIAXone (ROCEPHIN) 2 g in sodium chloride 0.9 % 100 mL IVPB  Status:  Discontinued        2 g 200 mL/hr over 30 Minutes Intravenous Every 24 hours 10/11/20 0944 10/11/20 1453   10/11/20 1800  vancomycin (VANCOREADY) IVPB 1250 mg/250 mL   Status:  Discontinued        1,250 mg 166.7 mL/hr over 90 Minutes Intravenous Every 12 hours 10/11/20 1611 10/12/20 0805   10/10/20 0600  vancomycin (VANCOREADY) IVPB 1250 mg/250 mL  Status:  Discontinued        1,250 mg 166.7 mL/hr over 90 Minutes Intravenous Every 12 hours 10/09/20 1202 10/11/20 0944   10/09/20 1230  vancomycin (VANCOREADY) IVPB 1500 mg/300 mL        1,500 mg 150 mL/hr over 120 Minutes Intravenous  Once 10/09/20 1154 10/09/20 1448   10/08/20 2200  ceFAZolin (ANCEF) IVPB 1 g/50 mL premix        1 g 100 mL/hr over 30 Minutes Intravenous Every 6 hours 10/08/20 1817 10/09/20 0402   10/08/20 1606  vancomycin (VANCOCIN) powder  Status:  Discontinued          As needed 10/08/20 1606 10/08/20 1806   10/08/20 1402  ceFAZolin (ANCEF) 2-4 GM/100ML-% IVPB       Note to Pharmacy: Linard Millers   : cabinet override      10/08/20 1402 10/09/20 0214       Patient was given sequential compression devices, early ambulation, and chemoprophylaxis to prevent DVT.  Patient benefited maximally from hospital stay and there were no complications.  Given IV antibiotics.   Recent vital signs:  Patient Vitals for the past 24  hrs:  BP Temp Temp src Pulse Resp SpO2  10/12/20 1356 130/81 98.1 F (36.7 C) -- 73 16 98 %  10/12/20 0548 127/81 98.3 F (36.8 C) Oral 66 17 99 %  10/11/20 2103 129/82 98.2 F (36.8 C) Oral 71 16 99 %     Recent laboratory studies: No results for input(s): WBC, HGB, HCT, PLT, NA, K, CL, CO2, BUN, CREATININE, GLUCOSE, INR, CALCIUM in the last 72 hours.  Invalid input(s): PT, 2   Discharge Medications:   Allergies as of 10/12/2020   No Known Allergies     Medication List    STOP taking these medications   HYDROcodone-acetaminophen 5-325 MG tablet Commonly known as: NORCO/VICODIN     TAKE these medications   aspirin 81 MG chewable tablet Chew 1 tablet (81 mg total) by mouth 2 (two) times daily.   daptomycin  IVPB Commonly known as: CUBICIN Inject  620 mg into the vein daily. Indication:  Prosthetic joint infection First Dose: No Last Day of Therapy:  11/20/2020 Labs - Once weekly:  CBC/D, BMP, and CPK Labs - Every other week:  ESR and CRP Method of administration: IV Push Method of administration may be changed at the discretion of home infusion pharmacist based upon assessment of the patient and/or caregiver's ability to self-administer the medication ordered.   diclofenac 75 MG EC tablet Commonly known as: VOLTAREN TAKE 1 TABLET (75 MG TOTAL) BY MOUTH 2 (TWO) TIMES DAILY BETWEEN MEALS AS NEEDED.   lisinopril 40 MG tablet Commonly known as: ZESTRIL Take 40 mg by mouth daily before breakfast.   methocarbamol 750 MG tablet Commonly known as: Robaxin-750 Take 1 tablet (750 mg total) by mouth every 6 (six) hours as needed for muscle spasms. What changed: Another medication with the same name was added. Make sure you understand how and when to take each.   methocarbamol 500 MG tablet Commonly known as: ROBAXIN Take 1 tablet (500 mg total) by mouth every 6 (six) hours as needed for muscle spasms. What changed: You were already taking a medication with the same name, and this prescription was added. Make sure you understand how and when to take each.   oxyCODONE 5 MG immediate release tablet Commonly known as: Oxy IR/ROXICODONE Take 1-2 tablets (5-10 mg total) by mouth every 4 (four) hours as needed for moderate pain (pain score 4-6).            Durable Medical Equipment  (From admission, onward)         Start     Ordered   10/08/20 1818  DME 3 n 1  Once        10/08/20 1817   10/08/20 1818  DME Walker rolling  Once       Question Answer Comment  Walker: With 5 Inch Wheels   Patient needs a walker to treat with the following condition Status post revision of total hip      10/08/20 1817           Discharge Care Instructions  (From admission, onward)         Start     Ordered   10/12/20 0000  Change dressing  on IV access line weekly and PRN  (Home infusion instructions - [facility] )        10/12/20 1424          Diagnostic Studies: DG Pelvis Portable  Result Date: 10/08/2020 CLINICAL DATA:  Postop EXAM: PORTABLE PELVIS 1-2 VIEWS COMPARISON:  09/20/2012, 10/08/2020 FINDINGS: Bilateral hip replacements. Normal alignment and intact hardware. Gas in the left hip soft tissues consistent with recent surgery. IMPRESSION: Bilateral hip replacements with expected postsurgical changes on the left. Electronically Signed   By: Donavan Foil M.D.   On: 10/08/2020 18:39   DG C-Arm 1-60 Min-No Report  Result Date: 10/08/2020 Fluoroscopy was utilized by the requesting physician.  No radiographic interpretation.   DG HIP OPERATIVE UNILAT W OR W/O PELVIS LEFT  Result Date: 10/08/2020 CLINICAL DATA:  Left hip revision EXAM: OPERATIVE LEFT HIP WITH PELVIS COMPARISON:  06/16/2020 FLUOROSCOPY TIME:  Radiation Exposure Index (as provided by the fluoroscopic device): 0.39 mGy If the device does not provide the exposure index: Fluoroscopy Time:  3 seconds Number of Acquired Images:  3 FINDINGS: Left hip prosthesis is noted in satisfactory position. No soft tissue or acute bony abnormality is noted. IMPRESSION: Status post left hip prosthesis revision. Electronically Signed   By: Inez Catalina M.D.   On: 10/08/2020 16:58   [name] EKG SITE RITE  Result Date: 10/11/2020 If Site Rite image not attached, placement could not be confirmed due to current cardiac rhythm.   Disposition: Discharge disposition: 01-Home or Self Care       Discharge Instructions    [facility] pharmacist to adjust dose for Vancomycin, Aminoglycosides and other anti-infective therapies as requested by physician.   Complete by: As directed    [facility] to provide Cath Flo 2mg    Complete by: As directed    Administer for PICC line occlusion and as ordered by physician for other access device issues.    Anaphylaxis Kit: Provided to treat any anaphylactic reaction to the medication being provided to the patient if First Dose or when requested by physician   Complete by: As directed    Epinephrine 1mg /ml vial / amp: Administer 0.3mg  (0.43ml) subcutaneously once for moderate to severe anaphylaxis, nurse to call physician and pharmacy when reaction occurs and call 911 if needed for immediate care   Diphenhydramine 50mg /ml IV vial: Administer 25-50mg  IV/IM PRN for first dose reaction, rash, itching, mild reaction, nurse to call physician and pharmacy when reaction occurs   Sodium Chloride 0.9% NS 597ml IV: Administer if needed for hypovolemic blood pressure drop or as ordered by physician after call to physician with anaphylactic reaction   Change dressing on IV access line weekly and PRN   Complete by: As directed    Flush IV access with Sodium Chloride 0.9% and Heparin 10 units/ml or 100 units/ml   Complete by: As directed    Home infusion instructions - [facility]   Complete by: As directed    Instructions: Flush IV access with Sodium Chloride 0.9% and Heparin 10units/ml or 100units/ml   Change dressing on IV access line: Weekly and PRN   Instructions Cath Flo 2mg : Administer for PICC Line occlusion and as ordered by physician for other access device   [facility] pharmacist to adjust dose for: Vancomycin, Aminoglycosides and other anti-infective therapies as requested by physician   Method of administration may be changed at the discretion of home infusion pharmacist based upon assessment of the patient and/or caregiver's ability to self-administer the medication ordered   Complete by: As directed        Follow-up Information    [facility] up.   Specialty: Home Health Services Why: agency will provide home health physical therapy and nursing visits Contact information: [place]  [place] [address] [phone]        Mcarthur Rossetti, MD Follow up in 2 week(s).   Specialty: Orthopedic Surgery Contact information: [address] [place] [address] [phone]                Signed: Erskine Emery 10/12/2020, 2:43 PM

## 2020-10-12 NOTE — Consult Note (Signed)
[facility] for Infectious Disease  Total days of antibiotics 4               Reason for Consult: streptococcal prosthetic joint infection of hip    Referring Physician: blackman  Principal Problem:   Pain in left hip Active Problems:   Status post revision of total hip    HPI: Sergio Parker is a 54 y.o. 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 This march he noticed a sudden pop and pain to left hip after climbing out of a recliner. Treated conservatively over the next few months with anti-inflammatory agents. Due to ongoing pain, he underwent MRI that was reported to show marked surrounding edema to the implant but no signficant fluid in the joint or signs of abscess. Dr Ninfa Linden took him to the OR on 12/17 for washout vs evaluation for pseudotumor or other inflammatory process. The implant was well seeded. No gross purulence  And minimal fluid collection at joint/soft tissue. He underwent polyliner exchange and hip ball exchange. Gram stain of fluid showed strep mitis R PCN R CTX R tetra. He was started on vancomycin. No fevers during this hospitalization  Past Medical History:  Diagnosis Date   Arthritis    Avascular necrosis ([place])    ED (erectile dysfunction)    Hypertension    PCP Dr Osborne Casco   Hypertriglyceridemia    Microalbuminuria    Obesity     Allergies: No Known Allergies   MEDICATIONS:  aspirin  81 mg Oral BID   Chlorhexidine Gluconate Cloth  6 each Topical Daily   docusate sodium  100 mg Oral BID   gabapentin  100 mg Oral TID   lisinopril  40 mg Oral QAC breakfast   pantoprazole  40 mg Oral Daily    Social History   Tobacco Use   Smoking status: Former Smoker    Types: Cigars   Smokeless tobacco: Current User    Types: Snuff   Tobacco comment: 1-2 cigars day years ago  Vaping Use   Vaping Use: Never used  Substance Use Topics   Alcohol use: Yes    Alcohol/week: 28.0 standard drinks    Types:  28 Cans of beer per week    Comment: "4 beers per day" per pt/RM   Drug use: No    Family History  Problem Relation Age of Onset   Hypertension Mother    Hypertension Father    Colon cancer Neg Hx     Review of Systems -   Constitutional: Negative for fever, chills, diaphoresis, activity change, appetite change, fatigue and unexpected weight change.  HENT: Negative for congestion, sore throat, rhinorrhea, sneezing, trouble swallowing and sinus pressure.  Eyes: Negative for photophobia and visual disturbance.  Respiratory: Negative for cough, chest tightness, shortness of breath, wheezing and stridor.  Cardiovascular: Negative for chest pain, palpitations and leg swelling.  Gastrointestinal: Negative for nausea, vomiting, abdominal pain, diarrhea, constipation, blood in stool, abdominal distention and anal bleeding.  Genitourinary: Negative for dysuria, hematuria, flank pain and difficulty urinating.  Musculoskeletal: Negative for myalgias, back pain, joint swelling, arthralgias and gait problem. +left hip pain Skin: Negative for color change, pallor, rash and wound.  Neurological: Negative for dizziness, tremors, weakness and light-headedness.  Hematological: Negative for adenopathy. Does not bruise/bleed easily.  Psychiatric/Behavioral: Negative for behavioral problems, confusion, sleep disturbance, dysphoric mood, decreased concentration and agitation.      OBJECTIVE: Temp:  [98.2  F (36.8 C)-98.4 F (36.9 C)] 98.3 F (36.8 C) (12/21 0548) Pulse Rate:  [60-71] 66 (12/21 0548) Resp:  [16-17] 17 (12/21 0548) BP: (121-129)/(69-82) 127/81 (12/21 0548) SpO2:  [99 %] 99 % (12/21 0548) Physical Exam  Constitutional: He is oriented to person, place, and time. He appears well-developed and well-nourished. No distress.  HENT:  Mouth/Throat: Oropharynx is clear and moist. No oropharyngeal exudate.  Cardiovascular: Normal rate, regular rhythm and normal heart sounds. Exam reveals  no gallop and no friction rub.  No murmur heard.  Pulmonary/Chest: Effort normal and breath sounds normal. No respiratory distress. He has no wheezes.  Abdominal: Soft. Bowel sounds are normal. He exhibits no distension. There is no tenderness.  Lymphadenopathy:  He has no cervical adenopathy.  Neurological: He is alert and oriented to person, place, and time.  Skin: Skin is warm and dry. No rash noted. No erythema. Left hip bandage in place, no drain Psychiatric: He has a normal mood and affect. His behavior is normal.     LABS: Results for orders placed or performed during the hospital encounter of 10/08/20 (from the past 48 hour(s))  Vancomycin, trough     Status: Abnormal   Collection Time: 10/12/20  5:00 AM  Result Value Ref Range   Vancomycin Tr 14 (L) 15 - 20 ug/mL    Comment: Performed at [facility], [place] [address], [place], [place] [address]    MICRO: Strep mitis/oralis R PCN R CTX IMAGING: [name] EKG SITE RITE  Result Date: 10/11/2020 If Site Rite image not attached, placement could not be confirmed due to current cardiac rhythm.    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 Consider linezolid vs. Doing long acting infusion such as dalbavancin or oritavancin - will plan to get sed rate and crp  - will follow up in the ID clinic in 4-6 wk - home health orders listed below in pharmacy note  Caren Griffins [facility] [phone]  H

## 2020-10-12 NOTE — Progress Notes (Signed)
PHARMACY CONSULT NOTE FOR:  OUTPATIENT  PARENTERAL ANTIBIOTIC THERAPY (OPAT)  Indication: Prosthetic joint infection Regimen: Daptomycin 620mg  IV daily End date: 11/20/2020  IV antibiotic discharge orders are pended. To discharging provider:  please sign these orders via discharge navigator,  Select New Orders & click on the button choice - Manage This Unsigned Work.     Thank you for allowing pharmacy to be a part of this patient's care.  Phillis Haggis 10/12/2020, 8:05 AM

## 2020-10-12 NOTE — Progress Notes (Signed)
Physical Therapy Treatment Patient Details Name: RAMIE ERMAN MRN: [id number] DOB: [birth year] Today's Date: 10/12/2020    History of Present Illness 54 yo male s/p L hip I&D, poly exchange 10/08/20. Hx of AVN, L THA-DA 12/2011, R THA-DA 08/2012    PT Comments    Pt continues to participate and progress well.    Follow Up Recommendations  Follow surgeon's recommendation for DC plan and follow-up therapies     Equipment Recommendations  Rolling walker with 5" wheels    Recommendations for Other Services       Precautions / Restrictions Precautions Precautions: Fall Restrictions Weight Bearing Restrictions: No Other Position/Activity Restrictions: WBAT    Mobility  Bed Mobility Overal bed mobility: Modified Independent             General bed mobility comments: Increased time.  Transfers Overall transfer level: Needs assistance Equipment used: Rolling walker (2 wheeled) Transfers: Sit to/from Stand Sit to Stand: Supervision         General transfer comment: Cues for hand placement.  Ambulation/Gait Ambulation/Gait assistance: Supervision Gait Distance (Feet): 275 Feet Assistive device: Rolling walker (2 wheeled) Gait Pattern/deviations: Step-through pattern;Decreased stride length     General Gait Details: no lob with RW use. Pt toleated distance well. WBing on L LE has improved   Stairs             Wheelchair Mobility    Modified Rankin (Stroke Patients Only)       Balance                                            Cognition Arousal/Alertness: Awake/alert Behavior During Therapy: WFL for tasks assessed/performed Overall Cognitive Status: Within Functional Limits for tasks assessed                                        Exercises Total Joint Exercises Hip ABduction/ADduction: AROM;Left;15 reps;Standing Long Arc Quad: AROM;Left;15 reps;Seated Knee Flexion: AROM;Left;15 reps;Standing Marching  in Standing: AROM;Both;15 reps;Standing General Exercises - Lower Extremity Heel Raises: 10 reps;AROM;15 reps;Standing    General Comments        Pertinent Vitals/Pain Pain Assessment: 0-10 Pain Score: 5  Pain Location: L hip, thigh Pain Descriptors / Indicators: Discomfort;Sore;Tightness;Aching Pain Intervention(s): Monitored during session    Home Living                      Prior Function            PT Goals (current goals can now be found in the care plan section) Progress towards PT goals: Progressing toward goals    Frequency    7X/week      PT Plan Current plan remains appropriate    Co-evaluation              AM-PAC PT "6 Clicks" Mobility   Outcome Measure  Help needed turning from your back to your side while in a flat bed without using bedrails?: None Help needed moving from lying on your back to sitting on the side of a flat bed without using bedrails?: None Help needed moving to and from a bed to a chair (including a wheelchair)?: A Little Help needed standing up from a chair using your arms (e.g., wheelchair or bedside chair)?:  A Little Help needed to walk in hospital room?: A Little Help needed climbing 3-5 steps with a railing? : A Little 6 Click Score: 20    End of Session Equipment Utilized During Treatment: Gait belt Activity Tolerance: Patient tolerated treatment well Patient left: in chair;with call bell/phone within reach   PT Visit Diagnosis: Other abnormalities of gait and mobility (R26.89)     Time: [id number] PT Time Calculation (min) (ACUTE ONLY): 19 min  Charges:  $Gait Training: 8-22 mins                         Doreatha Massed, PT Acute Rehabilitation  Office: [phone] Pager: [phone]

## 2020-10-12 NOTE — Progress Notes (Signed)
Patient ID: Sergio Parker, male   DOB: [birth year], 54 y.o.   MRN: [id number] Discharge today really depends on final antibiotics.  Dr. Graylon Good did let me know last evening that they were considering Dapto if his insurance covers that versus vancomycin.  Once that decision is made then home health antibiotics can be set up.  Will need pharmacy's assistance with that as well.

## 2020-10-12 NOTE — Progress Notes (Signed)
Patient ID: Sergio Parker, male   DOB: Dec 07, 1965, 54 y.o.   MRN: [id number] I appreciate ID consult and Dr. Graylon Good seeing Mr. Joswick.  He does have a PICC line in place now.  He is comfortable.  I changed his left hip dressing as incision is clean and dry and there is no redness.  He is mobilizing well.  Our plan will be to discharge him to home later today pending the final decision on antibiotics.  He is currently on vancomycin but they are looking at the possibility of another medication pending I believe insurance approval.  Once we know this that he can be discharged and this can be set up.

## 2020-10-13 LAB — AEROBIC/ANAEROBIC CULTURE W GRAM STAIN (SURGICAL/DEEP WOUND)

## 2020-10-13 LAB — SURGICAL PATHOLOGY

## 2020-10-18 DIAGNOSIS — T8452XA Infection and inflammatory reaction due to internal left hip prosthesis, initial encounter: Secondary | ICD-10-CM | POA: Diagnosis not present

## 2020-10-20 ENCOUNTER — Ambulatory Visit (INDEPENDENT_AMBULATORY_CARE_PROVIDER_SITE_OTHER): Payer: BC Managed Care – PPO | Admitting: Orthopaedic Surgery

## 2020-10-20 ENCOUNTER — Encounter: Payer: Self-pay | Admitting: Orthopaedic Surgery

## 2020-10-20 DIAGNOSIS — T8452XA Infection and inflammatory reaction due to internal left hip prosthesis, initial encounter: Secondary | ICD-10-CM | POA: Insufficient documentation

## 2020-10-20 DIAGNOSIS — Z96649 Presence of unspecified artificial hip joint: Secondary | ICD-10-CM

## 2020-10-20 DIAGNOSIS — T8452XD Infection and inflammatory reaction due to internal left hip prosthesis, subsequent encounter: Secondary | ICD-10-CM

## 2020-10-20 LAB — AEROBIC/ANAEROBIC CULTURE W GRAM STAIN (SURGICAL/DEEP WOUND)

## 2020-10-20 NOTE — Progress Notes (Signed)
Tammy Sours comes in for his first postoperative visit status post an I&D of his left hip and a hip ball and polyliner exchange.  We did find a strep infection.  He is followed by Dr. Jerolyn Center from infectious disease.  He is ambling with walker.  He says he is doing well and reports decreased pain as well as increase strength and range of motion.  Examination of his left hip incision says it looks good.  Remove the staples in place Steri-Strips.  He does have a moderate seroma and I was able to easily aspirate about 80 cc of fluid from the soft tissue area.  He felt immediate relief from that.  He will continue his IV antibiotics per his PICC line and per the infectious disease service.  I would like to see him back in just 1 week to see if we need to aspirate the seroma again.  Since he is so active and there is no calf pain and no foot and ankle swelling he can stop his aspirin.

## 2020-11-01 ENCOUNTER — Ambulatory Visit (INDEPENDENT_AMBULATORY_CARE_PROVIDER_SITE_OTHER): Payer: BC Managed Care – PPO | Admitting: Physician Assistant

## 2020-11-01 ENCOUNTER — Encounter: Payer: Self-pay | Admitting: Physician Assistant

## 2020-11-01 ENCOUNTER — Telehealth: Payer: Self-pay | Admitting: Orthopaedic Surgery

## 2020-11-01 DIAGNOSIS — Z96649 Presence of unspecified artificial hip joint: Secondary | ICD-10-CM

## 2020-11-01 DIAGNOSIS — T8452XD Infection and inflammatory reaction due to internal left hip prosthesis, subsequent encounter: Secondary | ICD-10-CM

## 2020-11-01 MED ORDER — OXYCODONE HCL 5 MG PO TABS: 5.0000 mg | ORAL_TABLET | ORAL | 0 refills | Status: DC | PRN

## 2020-11-01 NOTE — Progress Notes (Signed)
HPI: Sergio Parker returns today for follow up after I&D of his left hip with hip ball exchange and polyliner exchange.  He was found to have a strep infection he is now being followed by Dr. Graylon Good with infectious disease and remains on IV antibiotics.  He is overall doing well.  He has had no fevers chills.  He is mostly here today to evaluate for possible reaspiration seroma.  Physical exam: Left hip surgical incisions healing well no signs of infection.  No dehiscence.  Slight area of possible seroma attempted to aspiration reveals a dry tap.  Left calf supple nontender.  He is able to cross his leg over.  Impression: Status post revision left hip with I&D Left hip infection  Plan: He will continue work on range of motion strengthening the hip.  Follow-up with [name] at the end of the month as scheduled.  Sooner if there is any questions concerns.  Scar tissue mobilization encouraged

## 2020-11-01 NOTE — Telephone Encounter (Signed)
Received $25.00 cash and forms to be completed by [employer]/ Forwarding to [employer] today

## 2020-11-04 ENCOUNTER — Telehealth: Payer: Self-pay | Admitting: Orthopaedic Surgery

## 2020-11-04 NOTE — Telephone Encounter (Signed)
Patient aware that if he wants to take the diclofenac prn that is totally fine

## 2020-11-04 NOTE — Telephone Encounter (Signed)
Patient called. Would like Caryl Pina to call him. He has some questions about medication. [phone]

## 2020-11-09 ENCOUNTER — Telehealth: Payer: Self-pay | Admitting: Orthopaedic Surgery

## 2020-11-09 NOTE — Telephone Encounter (Signed)
Rec'd $25.00 cash and disability form/Forwarding to [employer] today

## 2020-11-23 ENCOUNTER — Telehealth: Payer: Self-pay

## 2020-11-23 ENCOUNTER — Ambulatory Visit (INDEPENDENT_AMBULATORY_CARE_PROVIDER_SITE_OTHER): Payer: 59 | Admitting: Internal Medicine

## 2020-11-23 ENCOUNTER — Other Ambulatory Visit: Payer: Self-pay

## 2020-11-23 ENCOUNTER — Encounter: Payer: Self-pay | Admitting: Internal Medicine

## 2020-11-23 VITALS — BP 125/84 | HR 75 | Temp 98.4°F | Wt 179.0 lb

## 2020-11-23 DIAGNOSIS — T8452XD Infection and inflammatory reaction due to internal left hip prosthesis, subsequent encounter: Secondary | ICD-10-CM

## 2020-11-23 DIAGNOSIS — Z79899 Other long term (current) drug therapy: Secondary | ICD-10-CM | POA: Diagnosis not present

## 2020-11-23 DIAGNOSIS — A491 Streptococcal infection, unspecified site: Secondary | ICD-10-CM

## 2020-11-23 NOTE — Telephone Encounter (Signed)
I spoke with Tim with [facility] and gave verbal orders to extend patient Daptomycin for 6 weeks starting tomorrow and end date 01/05/21 per Dr. Baxter Flattery.  Tim repeated the orders and verbalized understanding. Foxx Klarich T Brooks Sailors

## 2020-11-23 NOTE — Progress Notes (Signed)
Patient ID: Sergio Parker, male   DOB: 27-Nov-1965, 55 y.o.   MRN: [id number]  HPI Sergio Parker is a 55yo M who is being treated for streptococcal left hip prosthetic joint infection, who is at the 6 week mark of IV abtx. He tolerated abtx, no difficulty with doing flushes. Occasionally had tip of 3rd digit turn white, but he states that has happened before picc line.had to have seroma aspiration but otherwise healing well. Otherwise doing okay with his hip and mobility Outpatient Encounter Medications as of 11/23/2020  Medication Sig   aspirin 81 MG chewable tablet Chew 1 tablet (81 mg total) by mouth 2 (two) times daily.   diclofenac (VOLTAREN) 75 MG EC tablet TAKE 1 TABLET (75 MG TOTAL) BY MOUTH 2 (TWO) TIMES DAILY BETWEEN MEALS AS NEEDED.   lisinopril (PRINIVIL,ZESTRIL) 40 MG tablet Take 40 mg by mouth daily before breakfast.   methocarbamol (ROBAXIN) 500 MG tablet Take 1 tablet (500 mg total) by mouth every 6 (six) hours as needed for muscle spasms.   methocarbamol (ROBAXIN-750) 750 MG tablet Take 1 tablet (750 mg total) by mouth every 6 (six) hours as needed for muscle spasms.   oxyCODONE (OXY IR/ROXICODONE) 5 MG immediate release tablet Take 1-2 tablets (5-10 mg total) by mouth every 4 (four) hours as needed for moderate pain (pain score 4-6).   No facility-administered encounter medications on file as of 11/23/2020.     Patient Active Problem List   Diagnosis Date Noted   Prosthetic joint infection of left hip ([place]) 10/20/2020   Status post revision of total hip 10/08/2020   Pain in left hip 10/07/2020   Avascular necrosis of bones of both hips ([place]) 09/20/2012   Avascular necrosis of hip ([place]) 01/12/2012     Health Maintenance Due  Topic Date Due   Hepatitis C Screening  Never done   COVID-19 Vaccine (1) Never done   HIV Screening  Never done   TETANUS/TDAP  Never done   INFLUENZA VACCINE  Never done     Review of Systems  Physical Exam   BP 125/84     Pulse 75    Temp 98.4 F (36.9 C) (Oral)    Wt 179 lb (81.2 kg)    BMI 28.04 kg/m   Physical Exam  Constitutional: He is oriented to person, place, and time. He appears well-developed and well-nourished. No distress.  HENT:  Mouth/Throat: Oropharynx is clear and moist. No oropharyngeal exudate.  Ext: picc line is c/d/i  Skin: hip incision is c/d/i. No erythema Psychiatric: He has a normal mood and affect. His behavior is normal.    CBC Lab Results  Component Value Date   WBC 8.2 10/09/2020   RBC 4.32 10/09/2020   HGB 11.8 (L) 10/09/2020   HCT 36.8 (L) 10/09/2020   PLT 322 10/09/2020   MCV 85.2 10/09/2020   MCH 27.3 10/09/2020   MCHC 32.1 10/09/2020   RDW 13.9 10/09/2020   LYMPHSABS 1,123 08/25/2020   EOSABS 131 08/25/2020    BMET Lab Results  Component Value Date   NA 138 10/09/2020   K 4.2 10/09/2020   CL 102 10/09/2020   CO2 25 10/09/2020   GLUCOSE 128 (H) 10/09/2020   BUN 11 10/09/2020   CREATININE 0.85 10/09/2020   CALCIUM 8.8 (L) 10/09/2020   GFRNONAA >60 10/09/2020   GFRAA >90 09/21/2012    Streptococcus mitis/oralis      MIC    CEFTRIAXONE  Resistant 1    CLINDAMYCIN <=  0.25 SENS... Sensitive    PENICILLIN  Resistant 2    TETRACYCLINE >=16 RESIST... Resistant    VANCOMYCIN 0.25 SENSIT... 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 Will plan to extend dapto 620mg  IV daily x 6 more weeks. If intolerant, then switch to linezolid/tedozolid.  Long term medication management = will check CK level

## 2020-11-24 ENCOUNTER — Ambulatory Visit (INDEPENDENT_AMBULATORY_CARE_PROVIDER_SITE_OTHER): Payer: 59 | Admitting: Orthopaedic Surgery

## 2020-11-24 ENCOUNTER — Encounter: Payer: Self-pay | Admitting: Orthopaedic Surgery

## 2020-11-24 DIAGNOSIS — Z96649 Presence of unspecified artificial hip joint: Secondary | ICD-10-CM

## 2020-11-24 DIAGNOSIS — T8452XD Infection and inflammatory reaction due to internal left hip prosthesis, subsequent encounter: Secondary | ICD-10-CM

## 2020-11-24 NOTE — Progress Notes (Signed)
Sergio Parker comes in today now 6 weeks out from an I&D of his left hip with a polyliner exchange as well.  He did grow out a rare strep type of infection.  He is on IV antibiotics through PICC line and that is been to be continued for another 6 weeks given the type of infection he has.  He does report that he is doing better overall and his pain is significantly decreased.  He walks without assist device.  I assessed his left hip and his incision looks good.  There is no redness and no induration.  There is no drainage.  He will continue to increase his activities as comfort allows.  I would like to see him back in 6 weeks to see how he is doing overall.  At some point I do feel it is necessary that we obtained a repeat MRI once he is further out from surgery of his left hip to assess the progress of antibiotic treatments.  All question and concerns were answered and addressed.  He agrees with this treatment plan.

## 2020-12-03 ENCOUNTER — Other Ambulatory Visit: Payer: Self-pay | Admitting: Orthopaedic Surgery

## 2020-12-28 NOTE — TOC Benefit Eligibility Note (Signed)
Patient Advocate Encounter   Received notification that prior authorization for Sivextro 200 mg is required.   PA submitted on 12/28/2020 Key [id number] Status is pending       Lyndel Safe, CPhT Pharmacy Patient Advocate Specialist [facility] Antimicrobial Stewardship Team Direct Number: [phone]  Fax: [phone]

## 2020-12-29 NOTE — TOC Benefit Eligibility Note (Signed)
[facility] Patient Advocate Encounter  Received notification from [employer] that the request for prior authorization for Sivextro 200 mg has been denied due to not meeting FDA-approved indication and dosing.       Lyndel Safe, [place] Patient Advocate Specialist [place] Antimicrobial Stewardship Team Direct Number: [phone]  Fax: [phone]

## 2021-01-05 ENCOUNTER — Ambulatory Visit (INDEPENDENT_AMBULATORY_CARE_PROVIDER_SITE_OTHER): Payer: 59 | Admitting: Internal Medicine

## 2021-01-05 ENCOUNTER — Telehealth: Payer: Self-pay

## 2021-01-05 ENCOUNTER — Other Ambulatory Visit: Payer: Self-pay

## 2021-01-05 ENCOUNTER — Encounter: Payer: Self-pay | Admitting: Internal Medicine

## 2021-01-05 VITALS — BP 165/89 | HR 64 | Wt 182.6 lb

## 2021-01-05 DIAGNOSIS — A491 Streptococcal infection, unspecified site: Secondary | ICD-10-CM

## 2021-01-05 DIAGNOSIS — R5381 Other malaise: Secondary | ICD-10-CM

## 2021-01-05 DIAGNOSIS — I10 Essential (primary) hypertension: Secondary | ICD-10-CM

## 2021-01-05 DIAGNOSIS — T8452XD Infection and inflammatory reaction due to internal left hip prosthesis, subsequent encounter: Secondary | ICD-10-CM | POA: Diagnosis not present

## 2021-01-05 DIAGNOSIS — Z79899 Other long term (current) drug therapy: Secondary | ICD-10-CM

## 2021-01-05 NOTE — Telephone Encounter (Signed)
Verbal orders given to Melissa to pull PICC today after last dose of abx given. Labs from 3/14 to be faxed to our office.   Alenah Sarria Lorita Officer, RN

## 2021-01-05 NOTE — Progress Notes (Signed)
RFV: follow up for pji of right hip - streptococcus  Patient ID: Sergio Parker, male   DOB: 09-08-1966, 55 y.o.   MRN: [id number]  HPI 55yo M with streptococcal pji has been daptomycin for 6 wks. Has one more dose at home. Doing well.   Has had a minimal exercise, roughly at 40%of regular exercise.  He denies difficulty with abtx. No f/chills/nightsweats  Outpatient Encounter Medications as of 01/05/2021  Medication Sig  . lisinopril (PRINIVIL,ZESTRIL) 40 MG tablet Take 40 mg by mouth daily before breakfast.  . methocarbamol (ROBAXIN) 500 MG tablet Take 1 tablet (500 mg total) by mouth every 6 (six) hours as needed for muscle spasms.  . methocarbamol (ROBAXIN-750) 750 MG tablet Take 1 tablet (750 mg total) by mouth every 6 (six) hours as needed for muscle spasms.  Marland Kitchen oxyCODONE (OXY IR/ROXICODONE) 5 MG immediate release tablet Take 1-2 tablets (5-10 mg total) by mouth every 4 (four) hours as needed for moderate pain (pain score 4-6).  Marland Kitchen aspirin 81 MG chewable tablet Chew 1 tablet (81 mg total) by mouth 2 (two) times daily. (Patient not taking: Reported on 01/05/2021)  . diclofenac (VOLTAREN) 75 MG EC tablet TAKE 1 TABLET (75 MG TOTAL) BY MOUTH 2 (TWO) TIMES DAILY BETWEEN MEALS AS NEEDED.   No facility-administered encounter medications on file as of 01/05/2021.     Patient Active Problem List   Diagnosis Date Noted  . Prosthetic joint infection of left hip ([place]) 10/20/2020  . Status post revision of total hip 10/08/2020  . Pain in left hip 10/07/2020  . Avascular necrosis of bones of both hips ([place]) 09/20/2012  . Avascular necrosis of hip ([place]) 01/12/2012     Health Maintenance Due  Topic Date Due  . Hepatitis C Screening  Never done  . COVID-19 Vaccine (1) Never done  . HIV Screening  Never done  . TETANUS/TDAP  Never done  . INFLUENZA VACCINE  Never done  . COLONOSCOPY (Pts 45-11yrs Insurance coverage will need to be confirmed)  12/26/2020     Review of Systems Review  of Systems  Constitutional: Negative for fever, chills, diaphoresis, activity change, appetite change, fatigue and unexpected weight change.  HENT: Negative for congestion, sore throat, rhinorrhea, sneezing, trouble swallowing and sinus pressure.  Eyes: Negative for photophobia and visual disturbance.  Respiratory: Negative for cough, chest tightness, shortness of breath, wheezing and stridor.  Cardiovascular: Negative for chest pain, palpitations and leg swelling.  Gastrointestinal: Negative for nausea, vomiting, abdominal pain, diarrhea, constipation, blood in stool, abdominal distention and anal bleeding.  Genitourinary: Negative for dysuria, hematuria, flank pain and difficulty urinating.  Musculoskeletal: Negative for myalgias, back pain, joint swelling, arthralgias and gait problem.  Skin: Negative for color change, pallor, rash and wound.  Neurological: Negative for dizziness, tremors, weakness and light-headedness.  Hematological: Negative for adenopathy. Does not bruise/bleed easily.  Psychiatric/Behavioral: Negative for behavioral problems, confusion, sleep disturbance, dysphoric mood, decreased concentration and agitation.    Physical Exam   BP (!) 165/89   Pulse 64   Wt 182 lb 9.6 oz (82.8 kg)   BMI 28.60 kg/m    Physical Exam  Constitutional: He is oriented to person, place, and time. He appears well-developed and well-nourished. No distress.  HENT:  Mouth/Throat: Oropharynx is clear and moist. No oropharyngeal exudate.  Ext: right upper arm c/d/i picc line site  Neurological: He is alert and oriented to person, place, and time.  Skin: Skin is warm and dry. No rash noted. No  erythema.  Psychiatric: He has a normal mood and affect. His behavior is normal.    CBC Lab Results  Component Value Date   WBC 8.2 10/09/2020   RBC 4.32 10/09/2020   HGB 11.8 (L) 10/09/2020   HCT 36.8 (L) 10/09/2020   PLT 322 10/09/2020   MCV 85.2 10/09/2020   MCH 27.3 10/09/2020   MCHC  32.1 10/09/2020   RDW 13.9 10/09/2020   LYMPHSABS 1,123 08/25/2020   EOSABS 131 08/25/2020    BMET Lab Results  Component Value Date   NA 138 10/09/2020   K 4.2 10/09/2020   CL 102 10/09/2020   CO2 25 10/09/2020   GLUCOSE 128 (H) 10/09/2020   BUN 11 10/09/2020   CREATININE 0.85 10/09/2020   CALCIUM 8.8 (L) 10/09/2020   GFRNONAA >60 10/09/2020   GFRAA >90 09/21/2012      Assessment and Plan  Strep pji = has finished 3 months of treatment with iv dapto. call home health today to see if can pull today. Will check sed rate and crp -  From home health agency. If sed rate and crp are elevated will give linezolid for a few weeks.  Hypertension = "white coat syndrome"/ nervous today. He states that he has had normal BP when home health rn comes to visit  Deconditioning = recommend to start increasing exercise tolerance  RTC as needed if sed rate and crp are normalized

## 2021-01-10 ENCOUNTER — Ambulatory Visit (INDEPENDENT_AMBULATORY_CARE_PROVIDER_SITE_OTHER): Payer: 59 | Admitting: Orthopaedic Surgery

## 2021-01-10 ENCOUNTER — Encounter: Payer: Self-pay | Admitting: Orthopaedic Surgery

## 2021-01-10 DIAGNOSIS — T8452XD Infection and inflammatory reaction due to internal left hip prosthesis, subsequent encounter: Secondary | ICD-10-CM

## 2021-01-10 DIAGNOSIS — Z96649 Presence of unspecified artificial hip joint: Secondary | ICD-10-CM | POA: Diagnosis not present

## 2021-01-10 NOTE — Progress Notes (Signed)
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  He is finally off antibiotics as of last week.  He is followed closely by Dr. Karolee Ohs.  She has recommended that he work on now for strengthening and endurance and I agree with this as well.  He looks good overall.  He still has occasionally days where he has pain in that left hip.  The incision looks good and he tolerates range of motion of left hip easily.  He is walking without a limp.  Someone to have him work on hip strengthening and endurance for the next 3 weeks.  I will allow him to return for work duties without restrictions starting February 07, 2021.  Plan see him back in 3 months.  At that visit I like a standing low AP pelvis and lateral of his left hip.  We will likely then draw some labs from him including a CRP, sed rate and CBC.  I would even likely recommend a repeat MRI of the left hip.  All questions and concerns were answered and addressed.

## 2021-01-24 ENCOUNTER — Telehealth: Payer: Self-pay

## 2021-01-24 NOTE — Telephone Encounter (Signed)
Patient called requesting abx prior to dental appointment. Patient reports having a dental appointment on April 11th and Dr. Baxter Flattery stated that he needs oral abx prior to any dental work. Requesting prescription.   Mekhi Lascola Lorita Officer, RN

## 2021-01-28 ENCOUNTER — Other Ambulatory Visit: Payer: Self-pay | Admitting: Internal Medicine

## 2021-01-28 MED ORDER — AMOXICILLIN 500 MG PO CAPS: 500.0000 mg | ORAL_CAPSULE | Freq: Once | ORAL | 0 refills | Status: AC

## 2021-01-28 NOTE — Telephone Encounter (Signed)
Patient called to follow up on abx request for prophylactic antibiotics prior to dental appointment. Provider made aware of request and will follow up today  Carlean Purl, RN

## 2021-01-31 ENCOUNTER — Telehealth: Payer: Self-pay

## 2021-01-31 NOTE — Telephone Encounter (Signed)
Patient called he is requesting a call back from you regarding a return to work form call [phone]

## 2021-02-01 NOTE — Telephone Encounter (Signed)
02/07/21 

## 2021-02-01 NOTE — Telephone Encounter (Signed)
Patient will swing by to have me correct form and I will fax for him

## 2021-02-07 ENCOUNTER — Telehealth: Payer: Self-pay

## 2021-02-07 NOTE — Telephone Encounter (Signed)
Patient came by to get his paperwork

## 2021-02-07 NOTE — Telephone Encounter (Signed)
Pt would like a CB from [place] regarding his return to work form.

## 2021-03-24 IMAGING — DX DG PORTABLE PELVIS
1 series · 1 of 1 positions shown · non-contrast
Comparison: 09/20/2012, 10/08/2020

CLINICAL DATA: Postop

EXAM:
PORTABLE PELVIS 1-2 VIEWS

[pelvis ap]
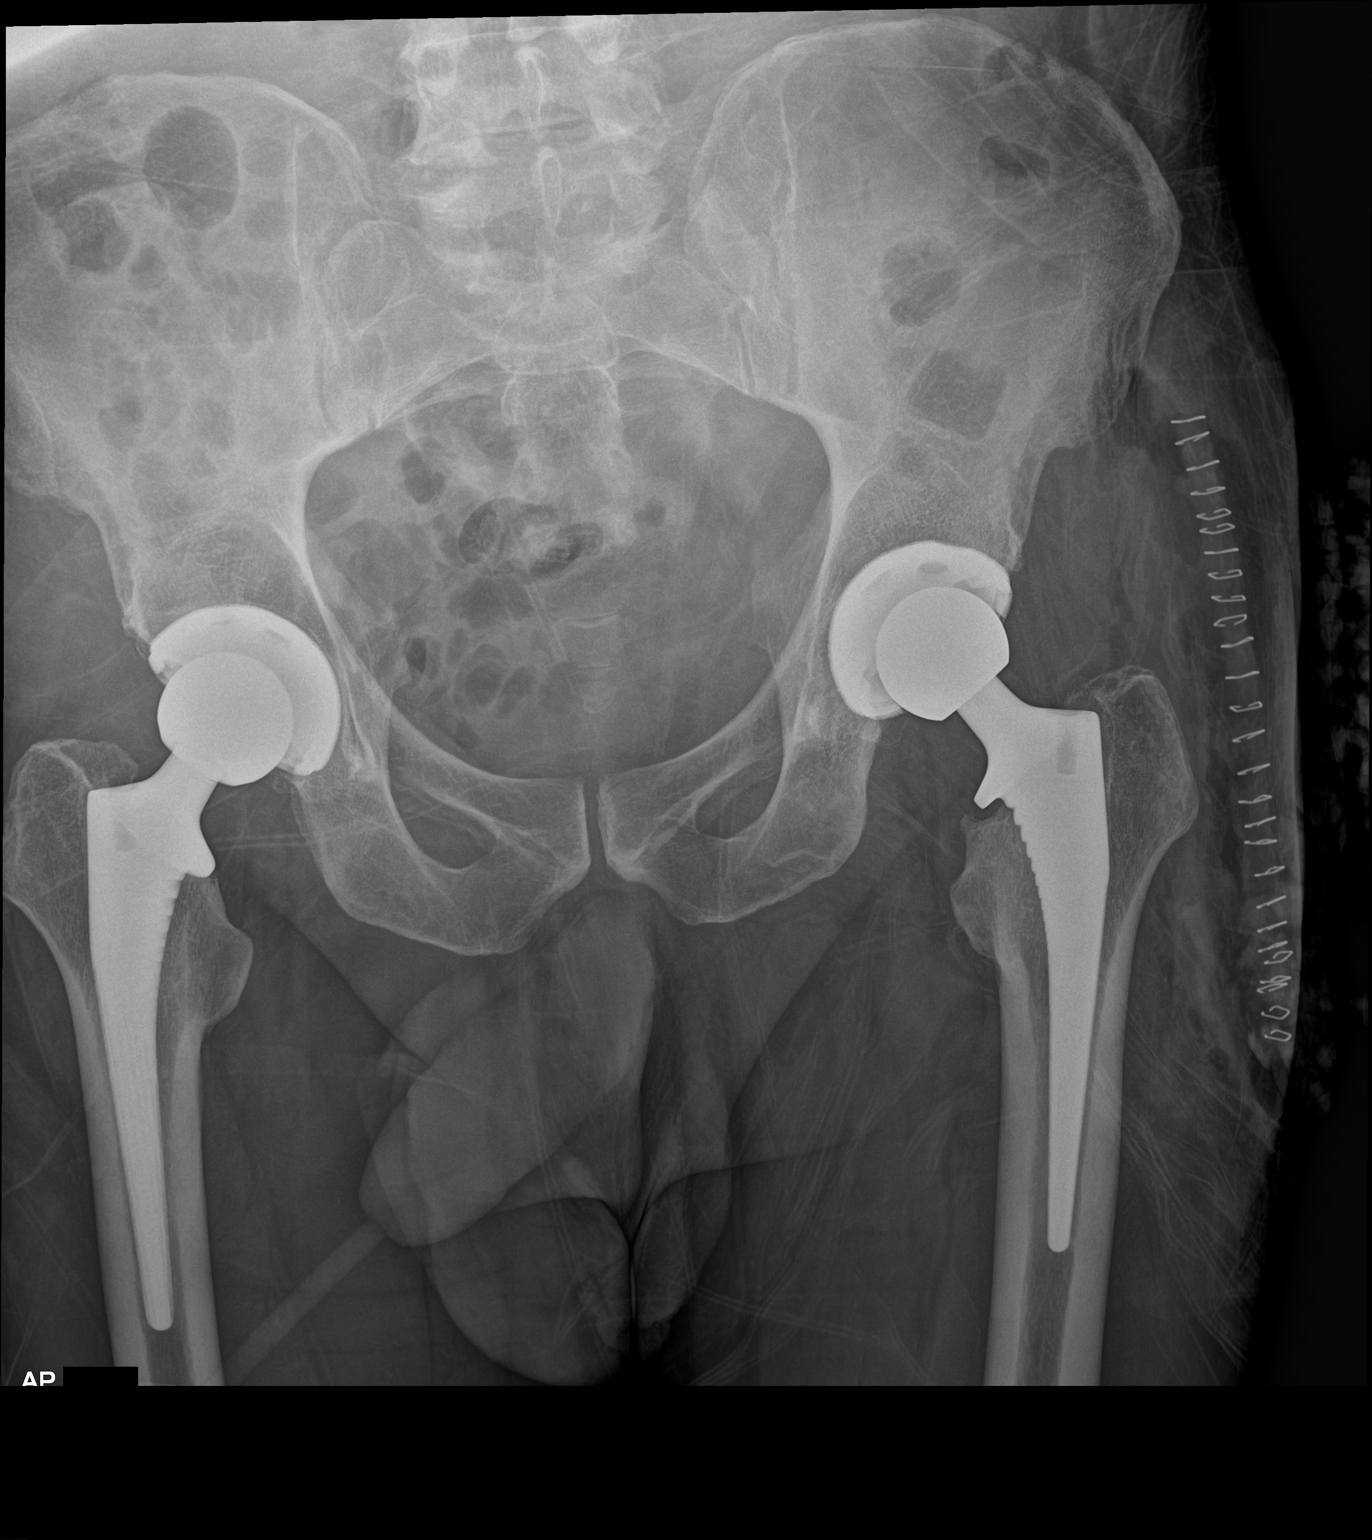

[1 of 1 positions shown; findings below may reference images not displayed]

FINDINGS: Bilateral hip replacements. Normal alignment and intact hardware.
Gas in the left hip soft tissues consistent with recent surgery.
IMPRESSION: Bilateral hip replacements with expected postsurgical changes on the
left.

## 2021-06-07 ENCOUNTER — Encounter: Payer: Self-pay | Admitting: Gastroenterology

## 2021-08-08 ENCOUNTER — Ambulatory Visit: Payer: 59 | Admitting: Orthopaedic Surgery

## 2021-08-08 ENCOUNTER — Ambulatory Visit: Payer: Self-pay

## 2021-08-08 ENCOUNTER — Encounter: Payer: Self-pay | Admitting: Orthopaedic Surgery

## 2021-08-08 DIAGNOSIS — Z96649 Presence of unspecified artificial hip joint: Secondary | ICD-10-CM

## 2021-08-08 DIAGNOSIS — T8452XD Infection and inflammatory reaction due to internal left hip prosthesis, subsequent encounter: Secondary | ICD-10-CM

## 2021-08-08 MED ORDER — METHOCARBAMOL 500 MG PO TABS: 500.0000 mg | ORAL_TABLET | Freq: Four times a day (QID) | ORAL | 1 refills | Status: DC | PRN

## 2021-08-08 MED ORDER — AMOXICILLIN 500 MG PO TABS: 500.0000 mg | ORAL_TABLET | Freq: Two times a day (BID) | ORAL | 3 refills | Status: DC

## 2021-08-08 NOTE — Progress Notes (Signed)
The patient is now 10 months out from an irrigation debridement with polyliner and hip ball exchange from her left hip.  The left hip had been replaced 8 years prior to the infection.  However, earlier in the year last year he had a significant dental procedure.  He also had trauma to his left hip with a hard fall and twisting injury that tore some muscles around the hip and cause a small fracture.  That did increase the blood flow to that left hip and this combined with a root canal that had infection seated the hip.  He is now asymptomatic having had an irrigation debridement and having been on antibiotics.  He denies any significant hip pain at all.  He walks without a limp.  He easily allows me to put his left hip through internal and external rotation with no pain at all.  There is no pain with compression of left hip at all.  NuPrep an AP pelvis and lateral of the left hip shows a well-seated total hip arthroplasty bilaterally with no complicating features.  The left hip shows no evidence of periosteal reaction or osteolysis.  There is nothing concerning on x-rays.  At this point follow-up can be as needed since he is doing so well.  I agree with prophylactic antibiotics for dental procedures for him given what happened.  I will send in some muscle relaxant for him to take as needed.  If things worsen at all he will let us know.

## 2022-01-19 ENCOUNTER — Encounter: Payer: Self-pay | Admitting: Gastroenterology

## 2022-01-26 ENCOUNTER — Ambulatory Visit (AMBULATORY_SURGERY_CENTER): Payer: Self-pay | Admitting: *Deleted

## 2022-01-26 VITALS — Ht 67.0 in | Wt 175.0 lb

## 2022-01-26 DIAGNOSIS — Z86010 Personal history of colonic polyps: Secondary | ICD-10-CM

## 2022-01-26 MED ORDER — NA SULFATE-K SULFATE-MG SULF 17.5-3.13-1.6 GM/177ML PO SOLN: 2.0000 | Freq: Once | ORAL | 0 refills | Status: AC

## 2022-01-26 NOTE — Progress Notes (Signed)

## 2022-02-01 ENCOUNTER — Encounter: Payer: Self-pay | Admitting: Gastroenterology

## 2022-02-08 ENCOUNTER — Encounter: Payer: Self-pay | Admitting: Certified Registered Nurse Anesthetist

## 2022-02-09 ENCOUNTER — Ambulatory Visit (AMBULATORY_SURGERY_CENTER): Payer: 59 | Admitting: Gastroenterology

## 2022-02-09 ENCOUNTER — Encounter: Payer: Self-pay | Admitting: Gastroenterology

## 2022-02-09 VITALS — BP 129/81 | HR 76 | Temp 98.7°F | Resp 15 | Ht 67.0 in | Wt 175.0 lb

## 2022-02-09 DIAGNOSIS — D122 Benign neoplasm of ascending colon: Secondary | ICD-10-CM | POA: Diagnosis not present

## 2022-02-09 DIAGNOSIS — Z86010 Personal history of colonic polyps: Secondary | ICD-10-CM | POA: Diagnosis present

## 2022-02-09 MED ORDER — SODIUM CHLORIDE 0.9 % IV SOLN: 500.0000 mL | Freq: Once | INTRAVENOUS | Status: DC

## 2022-02-09 NOTE — Progress Notes (Signed)
Report given to PACU, vss 

## 2022-02-09 NOTE — Op Note (Signed)
[name] ?Patient Name: Sergio Parker ?Procedure Date: 02/09/2022 2:00 PM ?MRN: [id number] ?Endoscopist: Estill Cotta. Sergio Parker , MD ?Age: 56 ?Referring MD:  ?Date of Birth: [birth year] ?Gender: Male ?Account #: [id number] ?Procedure:                Colonoscopy ?Indications:              Surveillance: Personal history of adenomatous  ?                          polyps on last colonoscopy > 5 years ago ?                          <13m TA March 2017 ?Medicines:                Monitored Anesthesia Care ?Procedure:                Pre-Anesthesia Assessment: ?                          - Prior to the procedure, a History and Physical  ?                          was performed, and patient medications and  ?                          allergies were reviewed. The patient's tolerance of  ?                          previous anesthesia was also reviewed. The risks  ?                          and benefits of the procedure and the sedation  ?                          options and risks were discussed with the patient.  ?                          All questions were answered, and informed consent  ?                          was obtained. Prior Anticoagulants: The patient has  ?                          taken no previous anticoagulant or antiplatelet  ?                          agents. ASA Grade Assessment: II - A patient with  ?                          mild systemic disease. After reviewing the risks  ?                          and benefits, the patient was deemed in  ?  satisfactory condition to undergo the procedure. ?                          After obtaining informed consent, the colonoscope  ?                          was passed under direct vision. Throughout the  ?                          procedure, the patient's blood pressure, pulse, and  ?                          oxygen saturations were monitored continuously. The  ?                          Colonoscope was introduced through the anus and  ?                           advanced to the the cecum, identified by  ?                          appendiceal orifice and ileocecal valve. The  ?                          colonoscopy was performed without difficulty. The  ?                          patient tolerated the procedure well. The quality  ?                          of the bowel preparation was good. The ileocecal  ?                          valve, appendiceal orifice, and rectum were  ?                          photographed. ?Scope In: 2:04:56 PM ?Scope Out: 2:20:15 PM ?Scope Withdrawal Time: 0 hours 13 minutes 18 seconds  ?Total Procedure Duration: 0 hours 15 minutes 19 seconds  ?Findings:                 The perianal and digital rectal examinations were  ?                          normal. ?                          Repeat examination of right colon under NBI  ?                          performed. ?                          Two sessile polyps were found in the ascending  ?  colon and cecum. The polyps were diminutive in  ?                          size. These polyps were removed with a cold snare.  ?                          Resection was complete, but the polyp tissue was  ?                          only partially retrieved. ?                          A few small-mouthed diverticula were found in the  ?                          left colon. ?                          Internal hemorrhoids were found. ?                          The exam was otherwise without abnormality on  ?                          direct and retroflexion views. ?Complications:            No immediate complications. ?Estimated Blood Loss:     Estimated blood loss was minimal. ?Impression:               - Two diminutive polyps in the ascending colon and  ?                          in the cecum, removed with a cold snare. Complete  ?                          resection. Partial retrieval. ?                          - Diverticulosis in the left colon. ?                           - Internal hemorrhoids. ?                          - The examination was otherwise normal on direct  ?                          and retroflexion views. ?Recommendation:           - Patient has a contact number available for  ?                          emergencies. The signs and symptoms of potential  ?                          delayed complications were discussed with the  ?  patient. Return to normal activities tomorrow.  ?                          Written discharge instructions were provided to the  ?                          patient. ?                          - Resume previous diet. ?                          - Continue present medications. ?                          - Await pathology results. ?                          - Repeat colonoscopy is recommended for  ?                          surveillance. The colonoscopy date will be  ?                          determined after pathology results from today's  ?                          exam become available for review. ?Molly Savarino L. Sergio Carrow, MD ?02/09/2022 2:24:52 PM ?This report has been signed electronically. ?

## 2022-02-09 NOTE — Progress Notes (Signed)
Called to room to assist during endoscopic procedure.  Patient ID and intended procedure confirmed with present staff. Received instructions for my participation in the procedure from the performing physician.  

## 2022-02-09 NOTE — Progress Notes (Signed)
History and Physical: ? This patient presents for endoscopic testing for: ?Encounter Diagnosis  ?Name Primary?  ? Personal history of colonic polyps Yes  ? ? ?This 56 year old man is here for surveillance colonoscopy.  On his last colonoscopy in March 2017 I removed 1 subcentimeter adenomatous polyp from the sigmoid colon.  There was an additional hyperplastic rectal polyp. ?Patient denies chronic abdominal pain, rectal bleeding, constipation or diarrhea. ? ? ?ROS: ?Patient denies chest pain or shortness of breath ? ? ?Past Medical History: ?Past Medical History:  ?Diagnosis Date  ? Arthritis   ? Avascular necrosis ([place])   ? ED (erectile dysfunction)   ? Hypertension   ? PCP Dr Osborne Casco  ? Hypertriglyceridemia   ? Microalbuminuria   ? Obesity   ? ? ? ?Past Surgical History: ?Past Surgical History:  ?Procedure Laterality Date  ? MOUTH SURGERY    ? TOTAL HIP ARTHROPLASTY  01/12/2012  ? Procedure: TOTAL HIP ARTHROPLASTY ANTERIOR APPROACH;  Surgeon: Mcarthur Rossetti, MD;  Location: [facility];  Service: Orthopedics;  Laterality: Left;  Left Total Hip Arthroplasty, Anterior Approach  ? TOTAL HIP ARTHROPLASTY  09/20/2012  ? Procedure: TOTAL HIP ARTHROPLASTY ANTERIOR APPROACH;  Surgeon: Mcarthur Rossetti, MD;  Location: [facility];  Service: Orthopedics;  Laterality: Right;  ? TOTAL HIP REVISION Left 10/08/2020  ? Procedure: IRRIGATION AND DEBRIDEMENT LEFT HIP, poly liner and hip ball exchange;  Surgeon: Mcarthur Rossetti, MD;  Location: [facility];  Service: Orthopedics;  Laterality: Left;  ? ? ?Allergies: ?No Known Allergies ? ?Outpatient Meds: ?Current Outpatient Medications  ?Medication Sig Dispense Refill  ? lisinopril (PRINIVIL,ZESTRIL) 40 MG tablet Take 40 mg by mouth daily before breakfast.    ? amoxicillin (AMOXIL) 500 MG tablet Take 1 tablet (500 mg total) by mouth 2 (two) times daily. Take two tablets 30 minutes to an hour before your dental procedure and then two tablets 6-8 hours after the procedure.  (Patient not taking: Reported on 01/26/2022) 4 tablet 3  ? aspirin 81 MG chewable tablet Chew 1 tablet (81 mg total) by mouth 2 (two) times daily. (Patient not taking: Reported on 01/05/2021) 30 tablet 0  ? diclofenac (VOLTAREN) 75 MG EC tablet TAKE 1 TABLET (75 MG TOTAL) BY MOUTH 2 (TWO) TIMES DAILY BETWEEN MEALS AS NEEDED. (Patient not taking: Reported on 01/26/2022) 60 tablet 1  ? methocarbamol (ROBAXIN) 500 MG tablet Take 1 tablet (500 mg total) by mouth every 6 (six) hours as needed for muscle spasms. (Patient not taking: Reported on 01/26/2022) 40 tablet 1  ? oxyCODONE (OXY IR/ROXICODONE) 5 MG immediate release tablet Take 1-2 tablets (5-10 mg total) by mouth every 4 (four) hours as needed for moderate pain (pain score 4-6). (Patient not taking: Reported on 01/26/2022) 30 tablet 0  ? ?Current Facility-Administered Medications  ?Medication Dose Route Frequency Provider Last Rate Last Admin  ? 0.9 %  sodium chloride infusion  500 mL Intravenous Once Doran Stabler, MD      ? ? ? ? ?___________________________________________________________________ ?Objective  ? ?Exam: ? ?BP (!) 155/92   Pulse 78   Temp 98.7 ?F (37.1 ?C)   Ht '5\' 7"'$  (1.702 m)   Wt 175 lb (79.4 kg)   SpO2 97%   BMI 27.41 kg/m?  ? ?CV: RRR without murmur, S1/S2 ?Resp: clear to auscultation bilaterally, normal RR and effort noted ?GI: soft, no tenderness, with active bowel sounds. ? ? ?Assessment: ?Encounter Diagnosis  ?Name Primary?  ? Personal history of colonic polyps Yes  ? ? ? ?  Plan: ?Colonoscopy ? The benefits and risks of the planned procedure were described in detail with the patient or (when appropriate) their health care proxy.  Risks were outlined as including, but not limited to, bleeding, infection, perforation, adverse medication reaction leading to cardiac or pulmonary decompensation, pancreatitis (if ERCP).  The limitation of incomplete mucosal visualization was also discussed.  No guarantees or warranties were given. ? ? ? ?The  patient is appropriate for an endoscopic procedure in the ambulatory setting. ? ? - Wilfrid Lund, MD ? ? ? ? ?

## 2022-02-09 NOTE — Patient Instructions (Signed)
YOU HAD AN ENDOSCOPIC PROCEDURE TODAY AT [facility]:   Refer to the procedure report that was given to you for any specific questions about what was found during the examination.  If the procedure report does not answer your questions, please call your gastroenterologist to clarify.  If you requested that your care partner not be given the details of your procedure findings, then the procedure report has been included in a sealed envelope for you to review at your convenience later. ? ?**Handout given on polyps and diverticulosis** ? ?YOU SHOULD EXPECT: Some feelings of bloating in the abdomen. Passage of more gas than usual.  Walking can help get rid of the air that was put into your GI tract during the procedure and reduce the bloating. If you had a lower endoscopy (such as a colonoscopy or flexible sigmoidoscopy) you may notice spotting of blood in your stool or on the toilet paper. If you underwent a bowel prep for your procedure, you may not have a normal bowel movement for a few days. ? ?Please Note:  You might notice some irritation and congestion in your nose or some drainage.  This is from the oxygen used during your procedure.  There is no need for concern and it should clear up in a day or so. ? ?SYMPTOMS TO REPORT IMMEDIATELY: ? ?Following lower endoscopy (colonoscopy or flexible sigmoidoscopy): ? Excessive amounts of blood in the stool ? Significant tenderness or worsening of abdominal pains ? Swelling of the abdomen that is new, acute ? Fever of 100?F or higher ? ? ?For urgent or emergent issues, a gastroenterologist can be reached at any hour by calling [phone]. ?Do not use MyChart messaging for urgent concerns.  ? ? ?DIET:  We do recommend a small meal at first, but then you may proceed to your regular diet.  Drink plenty of fluids but you should avoid alcoholic beverages for 24 hours. ? ?ACTIVITY:  You should plan to take it easy for the rest of today and you should NOT  DRIVE or use heavy machinery until tomorrow (because of the sedation medicines used during the test).   ? ?FOLLOW UP: ?Our staff will call the number listed on your records 48-72 hours following your procedure to check on you and address any questions or concerns that you may have regarding the information given to you following your procedure. If we do not reach you, we will leave a message.  We will attempt to reach you two times.  During this call, we will ask if you have developed any symptoms of COVID 19. If you develop any symptoms (ie: fever, flu-like symptoms, shortness of breath, cough etc.) before then, please call [phone].  If you test positive for Covid 19 in the 2 weeks post procedure, please call and report this information to [name].   ? ?If any biopsies were taken you will be contacted by phone or by letter within the next 1-3 weeks.  Please call us at [phone] if you have not heard about the biopsies in 3 weeks.  ? ? ?SIGNATURES/CONFIDENTIALITY: ?You and/or your care partner have signed paperwork which will be entered into your electronic medical record.  These signatures attest to the fact that that the information above on your After Visit Summary has been reviewed and is understood.  Full responsibility of the confidentiality of this discharge information lies with you and/or your care-partner.  ?

## 2022-02-13 ENCOUNTER — Telehealth: Payer: Self-pay | Admitting: *Deleted

## 2022-02-13 NOTE — Telephone Encounter (Signed)
Second attempt, left VM.  

## 2022-02-13 NOTE — Telephone Encounter (Signed)
First follow up call attempt.  No answer. ?

## 2022-02-16 ENCOUNTER — Encounter: Payer: Self-pay | Admitting: Gastroenterology

## 2022-05-28 ENCOUNTER — Ambulatory Visit
Admission: EM | Admit: 2022-05-28 | Discharge: 2022-05-28 | Disposition: A | Discharge status: Home / Self Care | Payer: 59 | Attending: Family Medicine | Admitting: Family Medicine

## 2022-05-28 DIAGNOSIS — J029 Acute pharyngitis, unspecified: Secondary | ICD-10-CM | POA: Insufficient documentation

## 2022-05-28 DIAGNOSIS — R0982 Postnasal drip: Secondary | ICD-10-CM | POA: Insufficient documentation

## 2022-05-28 LAB — POCT RAPID STREP A (OFFICE): Rapid Strep A Screen: NEGATIVE

## 2022-05-28 MED ORDER — LIDOCAINE VISCOUS HCL 2 % MT SOLN: 15.0000 mL | OROMUCOSAL | 0 refills | Status: DC | PRN

## 2022-05-28 NOTE — Discharge Instructions (Signed)
Rapid strep in office is negative.  Culture is pending if any additional treatment is warranted once those results are available we will contact her by phone. Purchase over-the-counter cetirizine brand-name Zyrtec this will help with nasal congestion and drainage.  I have prescribed lidocaine viscous, mixed with 2 to 4 ounces of warm water gargle and spit as needed for throat pain.

## 2022-05-28 NOTE — ED Triage Notes (Signed)
Pt reports sore throat that began yesterday, denies any other symptoms

## 2022-05-28 NOTE — ED Provider Notes (Signed)
Sergio Parker    CSN: [id number] Arrival date & time: 05/28/22  1037      History   Chief Complaint Chief Complaint  Patient presents with   Sore Throat    Entered by patient    HPI Sergio Parker is a 56 y.o. male.   HPI Patient presents for evaluation of sore throat x 1 day which worsened overnight. He noticed overnight post nasal drainage from right nostril and throat pain is localized to the right side of throat.  He has not taken any medication for symptoms. He's had no known sick contacts. He's afebrile.  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/2020   Pain in left hip 10/07/2020   Avascular necrosis of bones of both hips ([place]) 09/20/2012   Avascular necrosis of hip ([place]) 01/12/2012    Past Surgical History:  Procedure Laterality Date   MOUTH SURGERY     TOTAL HIP ARTHROPLASTY  01/12/2012   Procedure: TOTAL HIP ARTHROPLASTY ANTERIOR APPROACH;  Surgeon: Mcarthur Rossetti, MD;  Location: [facility];  Service: Orthopedics;  Laterality: Left;  Left Total Hip Arthroplasty, Anterior Approach   TOTAL HIP ARTHROPLASTY  09/20/2012   Procedure: TOTAL HIP ARTHROPLASTY ANTERIOR APPROACH;  Surgeon: Mcarthur Rossetti, MD;  Location: [facility];  Service: Orthopedics;  Laterality: Right;   TOTAL HIP REVISION Left 10/08/2020   Procedure: IRRIGATION AND DEBRIDEMENT LEFT HIP, poly liner and hip ball exchange;  Surgeon: Mcarthur Rossetti, MD;  Location: [facility];  Service: Orthopedics;  Laterality: Left;       Home Medications    Prior to Admission medications   Medication Sig Start Date End Date Taking? Authorizing Provider  lidocaine (XYLOCAINE) 2  % solution Use as directed 15 mLs in the mouth or throat every 3 (three) hours as needed (Oropharyngeal pain, mix with 3 ounces of warm water, gargal and spit). 05/28/22  Yes Scot Jun, FNP  amoxicillin (AMOXIL) 500 MG tablet Take 1 tablet (500 mg total) by mouth 2 (two) times daily. Take two tablets 30 minutes to an hour before your dental procedure and then two tablets 6-8 hours after the procedure. Patient not taking: Reported on 01/26/2022 08/08/21   Mcarthur Rossetti, MD  aspirin 81 MG chewable tablet Chew 1 tablet (81 mg total) by mouth 2 (two) times daily. Patient not taking: Reported on 01/05/2021 10/12/20   Mcarthur Rossetti, MD  diclofenac (VOLTAREN) 75 MG EC tablet TAKE 1 TABLET (75 MG TOTAL) BY MOUTH 2 (TWO) TIMES DAILY BETWEEN MEALS AS NEEDED. Patient not taking: Reported on 01/26/2022 12/03/20   Mcarthur Rossetti, MD  lisinopril (PRINIVIL,ZESTRIL) 40 MG tablet Take 40 mg by mouth daily before breakfast.    [provider]  methocarbamol (ROBAXIN) 500 MG tablet Take 1 tablet (500 mg total) by mouth every 6 (six) hours as needed for muscle spasms. Patient not taking: Reported on 01/26/2022 08/08/21   Mcarthur Rossetti, MD  oxyCODONE (OXY IR/ROXICODONE) 5 MG immediate release tablet Take 1-2 tablets (5-10 mg total) by mouth every 4 (four) hours as needed for moderate pain (pain score  4-6). Patient not taking: Reported on 01/26/2022 11/01/20   Pete Pelt, PA-C    Family History Family History  Problem Relation Age of Onset   Hypertension Mother    Hypertension Father    Colon cancer Neg Hx    Colon polyps Neg Hx    Esophageal cancer Neg Hx    Stomach cancer Neg Hx    Rectal cancer Neg Hx     Social History Social History   Tobacco Use   Smoking status: Former    Types: Cigars   Smokeless tobacco: Current    Types: Snuff   Tobacco comments:    1-2 cigars day years ago  Vaping Use   Vaping Use: Never used  Substance Use Topics    Alcohol use: Yes    Alcohol/week: 28.0 standard drinks of alcohol    Types: 28 Cans of beer per week    Comment: "4 beers per day" per pt/RM   Drug use: No     Allergies   Patient has no known allergies.   Review of Systems Review of Systems Pertinent negatives listed in HPI  Physical Exam Triage Vital Signs ED Triage Vitals  Enc Vitals Group     BP      Pulse      Resp      Temp      Temp src      SpO2      Weight      Height      Head Circumference      Peak Flow      Pain Score      Pain Loc      Pain Edu?      Excl. in [place]?    No data found.  Updated Vital Signs BP (!) 164/93 (BP Location: Left Arm)   Pulse 77   Temp 98.2 F (36.8 C) (Temporal)   Resp 16   SpO2 95%   Visual Acuity Right Eye Distance:   Left Eye Distance:   Bilateral Distance:    Right Eye Near:   Left Eye Near:    Bilateral Near:     Physical Exam Constitutional:      Appearance: He is well-developed.  HENT:     Head: Normocephalic and atraumatic.     Mouth/Throat:     Pharynx: Posterior oropharyngeal erythema and uvula swelling present. No oropharyngeal exudate.     Tonsils: 0 on the right. 0 on the left.  Eyes:     Conjunctiva/sclera: Conjunctivae normal.     Pupils: Pupils are equal, round, and reactive to light.  Cardiovascular:     Rate and Rhythm: Normal rate and regular rhythm.  Pulmonary:     Effort: Pulmonary effort is normal.     Breath sounds: Normal breath sounds.  Skin:    General: Skin is warm and dry.     Capillary Refill: Capillary refill takes less than 2 seconds.  Neurological:     General: No focal deficit present.     Mental Status: He is alert and oriented to person, place, and time.      UC Treatments / Results  Labs (all labs ordered are listed, but only abnormal results are displayed) Labs Reviewed  CULTURE, GROUP A STREP [facility])  POCT RAPID STREP A (OFFICE)    EKG   Radiology No results found.  Procedures Procedures (including  critical care time)  Medications Ordered in UC Medications - No data to display  Initial Impression /  Assessment and Plan / UC Course  I have reviewed the triage vital signs and the nursing notes.  Pertinent labs & imaging results that were available during my care of the patient were reviewed by me and considered in my medical decision making (see chart for details).    Rapid strep is negative. Throat culture pending. Symptom management per discharge medication orders and instructions. Return if symptoms worsen or do not improve. Final Clinical Impressions(s) / UC Diagnoses   Final diagnoses:  Sore throat  Post-nasal drainage     Discharge Instructions      Rapid strep in office is negative.  Culture is pending if any additional treatment is warranted once those results are available we will contact her by phone. Purchase over-the-counter cetirizine brand-name Zyrtec this will help with nasal congestion and drainage.  I have prescribed lidocaine viscous, mixed with 2 to 4 ounces of warm water gargle and spit as needed for throat pain.     ED Prescriptions     Medication Sig Dispense Auth. Provider   lidocaine (XYLOCAINE) 2 % solution Use as directed 15 mLs in the mouth or throat every 3 (three) hours as needed (Oropharyngeal pain, mix with 3 ounces of warm water, gargal and spit). 100 mL Scot Jun, FNP      PDMP not reviewed this encounter.   Scot Jun, FNP 05/28/22 1149

## 2022-05-30 LAB — CULTURE, GROUP A STREP (THRC)

## 2022-10-23 HISTORY — PX: UPPER GI ENDOSCOPY: SHX6162

## 2023-08-10 ENCOUNTER — Ambulatory Visit: Payer: 59 | Admitting: Family Medicine

## 2023-08-10 ENCOUNTER — Encounter: Payer: Self-pay | Admitting: Family Medicine

## 2023-08-10 VITALS — BP 136/80 | HR 61 | Temp 98.3°F | Ht 67.0 in | Wt 168.8 lb

## 2023-08-10 DIAGNOSIS — Z114 Encounter for screening for human immunodeficiency virus [HIV]: Secondary | ICD-10-CM

## 2023-08-10 DIAGNOSIS — E559 Vitamin D deficiency, unspecified: Secondary | ICD-10-CM

## 2023-08-10 DIAGNOSIS — R7309 Other abnormal glucose: Secondary | ICD-10-CM | POA: Diagnosis not present

## 2023-08-10 DIAGNOSIS — Z125 Encounter for screening for malignant neoplasm of prostate: Secondary | ICD-10-CM

## 2023-08-10 DIAGNOSIS — Z1159 Encounter for screening for other viral diseases: Secondary | ICD-10-CM | POA: Diagnosis not present

## 2023-08-10 DIAGNOSIS — Z23 Encounter for immunization: Secondary | ICD-10-CM | POA: Diagnosis not present

## 2023-08-10 DIAGNOSIS — S1121XA Laceration without foreign body of pharynx and cervical esophagus, initial encounter: Secondary | ICD-10-CM

## 2023-08-10 DIAGNOSIS — I10 Essential (primary) hypertension: Secondary | ICD-10-CM

## 2023-08-10 DIAGNOSIS — E538 Deficiency of other specified B group vitamins: Secondary | ICD-10-CM

## 2023-08-10 DIAGNOSIS — E785 Hyperlipidemia, unspecified: Secondary | ICD-10-CM

## 2023-08-10 DIAGNOSIS — Z720 Tobacco use: Secondary | ICD-10-CM

## 2023-08-10 LAB — CBC WITH DIFFERENTIAL/PLATELET
Basophils Absolute: 0 10*3/uL (ref 0.0–0.1)
Basophils Relative: 1.2 % (ref 0.0–3.0)
Eosinophils Absolute: 0.1 10*3/uL (ref 0.0–0.7)
Eosinophils Relative: 3.8 % (ref 0.0–5.0)
HCT: 44 % (ref 39.0–52.0)
Hemoglobin: 14.1 g/dL (ref 13.0–17.0)
Lymphocytes Relative: 24.5 % (ref 12.0–46.0)
Lymphs Abs: 1 10*3/uL (ref 0.7–4.0)
MCHC: 32.1 g/dL (ref 30.0–36.0)
MCV: 87.9 fL (ref 78.0–100.0)
Monocytes Absolute: 0.3 10*3/uL (ref 0.1–1.0)
Monocytes Relative: 8.2 % (ref 3.0–12.0)
Neutro Abs: 2.5 10*3/uL (ref 1.4–7.7)
Neutrophils Relative %: 62.3 % (ref 43.0–77.0)
Platelets: 246 10*3/uL (ref 150.0–400.0)
RBC: 5 Mil/uL (ref 4.22–5.81)
RDW: 12.9 % (ref 11.5–15.5)
WBC: 4 10*3/uL (ref 4.0–10.5)

## 2023-08-10 LAB — LIPID PANEL
Cholesterol: 189 mg/dL (ref 0–200)
HDL: 45.9 mg/dL (ref >39.00)
LDL Cholesterol: 123 mg/dL — ABNORMAL HIGH (ref 0–99)
NonHDL: 143.25
Total CHOL/HDL Ratio: 4
Triglycerides: 100 mg/dL (ref 0.0–149.0)
VLDL: 20 mg/dL (ref 0.0–40.0)

## 2023-08-10 LAB — COMPREHENSIVE METABOLIC PANEL
ALT: 23 U/L (ref 0–53)
AST: 19 U/L (ref 0–37)
Albumin: 4.4 g/dL (ref 3.5–5.2)
Alkaline Phosphatase: 97 U/L (ref 39–117)
BUN: 10 mg/dL (ref 6–23)
CO2: 31 meq/L (ref 19–32)
Calcium: 9.8 mg/dL (ref 8.4–10.5)
Chloride: 104 meq/L (ref 96–112)
Creatinine, Ser: 0.87 mg/dL (ref 0.40–1.50)
GFR: 95.2 mL/min (ref >60.00)
Glucose, Bld: 104 mg/dL — ABNORMAL HIGH (ref 70–99)
Potassium: 4.9 meq/L (ref 3.5–5.1)
Sodium: 142 meq/L (ref 135–145)
Total Bilirubin: 0.6 mg/dL (ref 0.2–1.2)
Total Protein: 7.1 g/dL (ref 6.0–8.3)

## 2023-08-10 LAB — VITAMIN B12: Vitamin B-12: 209 pg/mL — ABNORMAL LOW (ref 211–911)

## 2023-08-10 LAB — HEMOGLOBIN A1C: Hgb A1c MFr Bld: 5.9 % (ref 4.6–6.5)

## 2023-08-10 LAB — TSH: TSH: 0.92 u[IU]/mL (ref 0.35–5.50)

## 2023-08-10 LAB — VITAMIN D 25 HYDROXY (VIT D DEFICIENCY, FRACTURES): VITD: 28.66 ng/mL — ABNORMAL LOW (ref 30.00–100.00)

## 2023-08-10 LAB — PSA: PSA: 0.77 ng/mL (ref 0.10–4.00)

## 2023-08-10 NOTE — Patient Instructions (Addendum)
It was a pleasure meeting you today. Thank you for allowing me to take part in your health care.  Our goals for today as we discussed include:  We will get some labs today.  If they are abnormal or we need to do something about them, I will call you.  If they are normal, I will send you a message on MyChart (if it is active) or a letter in the mail.  If you don't hear from [name] in 2 weeks, please call the office at the number below.     This is a list of the screening recommended for you and due dates:  Health Maintenance  Topic Date Due   COVID-19 Vaccine (1) Never done   HIV Screening  Never done   Hepatitis C Screening  Never done   DTaP/Tdap/Td vaccine (1 - Tdap) Never done   Zoster (Shingles) Vaccine (1 of 2) Never done   Colon Cancer Screening  02/09/2029   Flu Shot  Completed   HPV Vaccine  Aged Out     Follow up as needed  If you have any questions or concerns, please do not hesitate to call the office at [phone].  I look forward to our next visit and until then take care and stay safe.  Regards,   Dana Allan, MD   [facility]

## 2023-08-10 NOTE — Progress Notes (Unsigned)
   SUBJECTIVE:   Chief Complaint  Patient presents with  . Establish Care   HPI ***  PERTINENT PMH / PSH: ***  OBJECTIVE:  BP 136/80   Pulse 61   Temp 98.3 F (36.8 C) (Oral)   Ht 5\' 7"  (1.702 m)   Wt 168 lb 12.8 oz (76.6 kg)   SpO2 99%   BMI 26.44 kg/m    Physical Exam     08/10/2023   10:06 AM 01/05/2021    8:44 AM 11/25/2015    8:32 AM  Depression screen PHQ 2/9  Decreased Interest 0 0 1  Down, Depressed, Hopeless 0 0 0  PHQ - 2 Score 0 0 1  Altered sleeping 0    Tired, decreased energy 0    Change in appetite 0    Feeling bad or failure about yourself  0    Trouble concentrating 0    Moving slowly or fidgety/restless 0    Suicidal thoughts 0    PHQ-9 Score 0    Difficult doing work/chores Not difficult at all        08/10/2023   10:06 AM  GAD 7 : Generalized Anxiety Score  Nervous, Anxious, on Edge 0  Control/stop worrying 0  Worry too much - different things 0  Trouble relaxing 0  Restless 0  Easily annoyed or irritable 0  Afraid - awful might happen 0  Total GAD 7 Score 0  Anxiety Difficulty Not difficult at all    ASSESSMENT/PLAN:  There are no diagnoses linked to this encounter. PDMP reviewed***  No follow-ups on file.  Dana Allan, MD

## 2023-08-11 LAB — HEPATITIS C ANTIBODY: Hepatitis C Ab: NONREACTIVE

## 2023-08-11 LAB — HIV ANTIBODY (ROUTINE TESTING W REFLEX): HIV 1&2 Ab, 4th Generation: NONREACTIVE

## 2023-08-14 ENCOUNTER — Encounter: Payer: Self-pay | Admitting: Family Medicine

## 2023-08-14 DIAGNOSIS — I10 Essential (primary) hypertension: Secondary | ICD-10-CM | POA: Insufficient documentation

## 2023-08-14 DIAGNOSIS — Z1159 Encounter for screening for other viral diseases: Secondary | ICD-10-CM | POA: Insufficient documentation

## 2023-08-14 DIAGNOSIS — S1121XA Laceration without foreign body of pharynx and cervical esophagus, initial encounter: Secondary | ICD-10-CM | POA: Insufficient documentation

## 2023-08-14 DIAGNOSIS — Z23 Encounter for immunization: Secondary | ICD-10-CM | POA: Insufficient documentation

## 2023-08-14 DIAGNOSIS — Z720 Tobacco use: Secondary | ICD-10-CM | POA: Insufficient documentation

## 2023-08-14 DIAGNOSIS — E559 Vitamin D deficiency, unspecified: Secondary | ICD-10-CM | POA: Insufficient documentation

## 2023-08-14 DIAGNOSIS — Z125 Encounter for screening for malignant neoplasm of prostate: Secondary | ICD-10-CM | POA: Insufficient documentation

## 2023-08-14 DIAGNOSIS — E538 Deficiency of other specified B group vitamins: Secondary | ICD-10-CM | POA: Insufficient documentation

## 2023-08-14 DIAGNOSIS — E785 Hyperlipidemia, unspecified: Secondary | ICD-10-CM | POA: Insufficient documentation

## 2023-08-14 DIAGNOSIS — Z114 Encounter for screening for human immunodeficiency virus [HIV]: Secondary | ICD-10-CM | POA: Insufficient documentation

## 2023-08-14 DIAGNOSIS — R7309 Other abnormal glucose: Secondary | ICD-10-CM | POA: Insufficient documentation

## 2023-08-14 MED ORDER — VITAMIN D (ERGOCALCIFEROL) 1.25 MG (50000 UNIT) PO CAPS: 50000.0000 [IU] | ORAL_CAPSULE | ORAL | 1 refills | Status: AC

## 2023-08-14 MED ORDER — VITAMIN B-12 1000 MCG PO TABS: 1000.0000 ug | ORAL_TABLET | Freq: Every day | ORAL | 3 refills | Status: AC

## 2023-08-14 NOTE — Assessment & Plan Note (Addendum)
History of elevated triglycerides. Patient has made dietary changes, but effectiveness is unclear. -Order fasting lipid panel to assess current status. -Discuss potential need for statin therapy depending on results. The 10-year ASCVD risk score (Arnett DK, et al., 2019) is: 15.9%  Recommend initiating Crestor 20 mg daily.  If patient agreeable will send in prescription. Avoid ASA given recent esophageal tear/bleed

## 2023-08-14 NOTE — Assessment & Plan Note (Signed)
History of smokeless tobacco use. -Encourage cessation and regular oral cancer screenings with dentist.

## 2023-08-14 NOTE — Assessment & Plan Note (Signed)
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 Currently on Protonix and has a follow-up endoscopy scheduled. -Continue Protonix 40 mg BID as prescribed. -Advise against forceful vomiting and encourage immediate medical attention if similar episode occurs.

## 2023-08-14 NOTE — Assessment & Plan Note (Signed)
Home readings in the 140s, which is technically within the JNC 8 guidelines for his age and absence of comorbidities. However, aiming for lower readings to reduce long-term risk. -Continue Lisinopril 40mg  daily. -Encourage lifestyle modifications including exercise and weight loss. -Monitor blood pressure at home and report if consistently above 145 systolic or 90 diastolic.

## 2023-08-15 ENCOUNTER — Encounter: Payer: Self-pay | Admitting: Family Medicine

## 2023-08-16 ENCOUNTER — Other Ambulatory Visit: Payer: Self-pay | Admitting: Family Medicine

## 2023-08-16 ENCOUNTER — Other Ambulatory Visit: Payer: Self-pay

## 2023-08-16 DIAGNOSIS — E785 Hyperlipidemia, unspecified: Secondary | ICD-10-CM

## 2023-08-16 MED ORDER — ROSUVASTATIN CALCIUM 20 MG PO TABS: 20.0000 mg | ORAL_TABLET | Freq: Every day | ORAL | 3 refills | Status: DC

## 2023-08-16 MED ORDER — ROSUVASTATIN CALCIUM 20 MG PO TABS: 20.0000 mg | ORAL_TABLET | Freq: Every day | ORAL | 3 refills | Status: AC

## 2023-08-16 NOTE — Telephone Encounter (Signed)
Called pt and advised him of this information. I resent rx request to [facility] [place] 

## 2023-11-26 ENCOUNTER — Other Ambulatory Visit: Payer: Self-pay | Admitting: Family Medicine

## 2023-11-26 ENCOUNTER — Encounter: Payer: Self-pay | Admitting: Family Medicine

## 2023-11-26 DIAGNOSIS — I10 Essential (primary) hypertension: Secondary | ICD-10-CM

## 2023-11-26 MED ORDER — LISINOPRIL 40 MG PO TABS: 40.0000 mg | ORAL_TABLET | Freq: Every day | ORAL | 3 refills | Status: AC

## 2024-01-25 ENCOUNTER — Other Ambulatory Visit: Payer: Self-pay | Admitting: Family Medicine

## 2024-01-25 DIAGNOSIS — E559 Vitamin D deficiency, unspecified: Secondary | ICD-10-CM
